# Patient Record
Sex: Female | Born: 1955 | Race: White | Hispanic: No | Marital: Married | Smoking: Never smoker
Health system: Southern US, Community
[De-identification: ages and names within clinical notes are randomized; demographics above are authoritative.]

## PROBLEM LIST (undated history)

## (undated) DIAGNOSIS — T7840XA Allergy, unspecified, initial encounter: Secondary | ICD-10-CM

## (undated) DIAGNOSIS — L80 Vitiligo: Secondary | ICD-10-CM

## (undated) DIAGNOSIS — Z83719 Family history of colon polyps, unspecified: Secondary | ICD-10-CM

## (undated) DIAGNOSIS — I10 Essential (primary) hypertension: Secondary | ICD-10-CM

## (undated) DIAGNOSIS — I341 Nonrheumatic mitral (valve) prolapse: Secondary | ICD-10-CM

## (undated) DIAGNOSIS — J189 Pneumonia, unspecified organism: Secondary | ICD-10-CM

## (undated) DIAGNOSIS — B001 Herpesviral vesicular dermatitis: Secondary | ICD-10-CM

## (undated) DIAGNOSIS — R011 Cardiac murmur, unspecified: Secondary | ICD-10-CM

## (undated) DIAGNOSIS — F988 Other specified behavioral and emotional disorders with onset usually occurring in childhood and adolescence: Secondary | ICD-10-CM

## (undated) DIAGNOSIS — T8859XA Other complications of anesthesia, initial encounter: Secondary | ICD-10-CM

## (undated) DIAGNOSIS — M1990 Unspecified osteoarthritis, unspecified site: Secondary | ICD-10-CM

## (undated) DIAGNOSIS — Z8371 Family history of colonic polyps: Secondary | ICD-10-CM

## (undated) DIAGNOSIS — T4145XA Adverse effect of unspecified anesthetic, initial encounter: Secondary | ICD-10-CM

## (undated) HISTORY — PX: TONSILLECTOMY: SUR1361

## (undated) HISTORY — DX: Herpesviral vesicular dermatitis: B00.1

## (undated) HISTORY — DX: Allergy, unspecified, initial encounter: T78.40XA

## (undated) HISTORY — PX: KNEE ARTHROSCOPY: SUR90

## (undated) HISTORY — DX: Family history of colonic polyps: Z83.71

## (undated) HISTORY — DX: Vitiligo: L80

## (undated) HISTORY — DX: Other specified behavioral and emotional disorders with onset usually occurring in childhood and adolescence: F98.8

## (undated) HISTORY — DX: Family history of colon polyps, unspecified: Z83.719

## (undated) HISTORY — PX: TUBAL LIGATION: SHX77

## (undated) HISTORY — DX: Nonrheumatic mitral (valve) prolapse: I34.1

---

## 1999-08-02 ENCOUNTER — Encounter: Payer: Self-pay | Admitting: Family Medicine

## 1999-08-02 ENCOUNTER — Encounter: Admission: RE | Admit: 1999-08-02 | Discharge: 1999-08-02 | Payer: Self-pay | Admitting: Family Medicine

## 1999-08-16 ENCOUNTER — Encounter: Admission: RE | Admit: 1999-08-16 | Discharge: 1999-08-16 | Payer: Self-pay | Admitting: Obstetrics and Gynecology

## 1999-08-16 ENCOUNTER — Encounter: Payer: Self-pay | Admitting: Obstetrics and Gynecology

## 2002-11-23 ENCOUNTER — Encounter (INDEPENDENT_AMBULATORY_CARE_PROVIDER_SITE_OTHER): Payer: Self-pay | Admitting: Specialist

## 2002-11-23 ENCOUNTER — Ambulatory Visit (HOSPITAL_COMMUNITY): Admission: RE | Admit: 2002-11-23 | Discharge: 2002-11-23 | Payer: Self-pay | Admitting: Gastroenterology

## 2003-12-15 ENCOUNTER — Encounter: Admission: RE | Admit: 2003-12-15 | Discharge: 2003-12-15 | Payer: Self-pay | Admitting: Family Medicine

## 2006-02-18 LAB — HM COLONOSCOPY: HM Colonoscopy: NORMAL

## 2006-06-14 ENCOUNTER — Ambulatory Visit: Payer: Self-pay | Admitting: Family Medicine

## 2007-10-27 ENCOUNTER — Ambulatory Visit: Payer: Self-pay | Admitting: Family Medicine

## 2008-02-24 LAB — HM MAMMOGRAPHY: HM Mammogram: NEGATIVE

## 2008-05-27 ENCOUNTER — Ambulatory Visit: Payer: Self-pay | Admitting: Family Medicine

## 2008-07-02 ENCOUNTER — Ambulatory Visit: Payer: Self-pay | Admitting: Family Medicine

## 2008-08-04 ENCOUNTER — Ambulatory Visit: Payer: Self-pay | Admitting: Family Medicine

## 2009-02-07 ENCOUNTER — Ambulatory Visit: Payer: Self-pay | Admitting: Family Medicine

## 2009-09-13 ENCOUNTER — Ambulatory Visit: Payer: Self-pay | Admitting: Family Medicine

## 2009-09-13 ENCOUNTER — Encounter: Payer: Self-pay | Admitting: Cardiovascular Disease

## 2009-10-01 LAB — HM DEXA SCAN: HM Dexa Scan: NORMAL

## 2010-03-22 ENCOUNTER — Encounter: Payer: Self-pay | Admitting: Cardiovascular Disease

## 2010-03-22 ENCOUNTER — Ambulatory Visit: Payer: Self-pay | Admitting: Family Medicine

## 2010-03-23 ENCOUNTER — Encounter: Payer: Self-pay | Admitting: Cardiovascular Disease

## 2010-04-13 DIAGNOSIS — R0789 Other chest pain: Secondary | ICD-10-CM | POA: Insufficient documentation

## 2010-04-13 DIAGNOSIS — R5383 Other fatigue: Secondary | ICD-10-CM

## 2010-04-13 DIAGNOSIS — R000 Tachycardia, unspecified: Secondary | ICD-10-CM | POA: Insufficient documentation

## 2010-04-13 DIAGNOSIS — R42 Dizziness and giddiness: Secondary | ICD-10-CM | POA: Insufficient documentation

## 2010-04-13 DIAGNOSIS — R5381 Other malaise: Secondary | ICD-10-CM | POA: Insufficient documentation

## 2010-04-13 DIAGNOSIS — I059 Rheumatic mitral valve disease, unspecified: Secondary | ICD-10-CM | POA: Insufficient documentation

## 2010-04-13 DIAGNOSIS — I341 Nonrheumatic mitral (valve) prolapse: Secondary | ICD-10-CM | POA: Insufficient documentation

## 2010-04-18 ENCOUNTER — Ambulatory Visit: Payer: Self-pay | Admitting: Cardiovascular Disease

## 2010-04-26 ENCOUNTER — Telehealth (INDEPENDENT_AMBULATORY_CARE_PROVIDER_SITE_OTHER): Payer: Self-pay | Admitting: *Deleted

## 2010-04-26 ENCOUNTER — Ambulatory Visit: Payer: Self-pay

## 2010-04-26 ENCOUNTER — Encounter: Payer: Self-pay | Admitting: Cardiovascular Disease

## 2010-04-26 ENCOUNTER — Ambulatory Visit (HOSPITAL_COMMUNITY): Admission: RE | Admit: 2010-04-26 | Discharge: 2010-04-26 | Payer: Self-pay | Admitting: Internal Medicine

## 2010-04-26 ENCOUNTER — Ambulatory Visit: Payer: Self-pay | Admitting: Cardiology

## 2010-05-22 ENCOUNTER — Ambulatory Visit: Payer: Self-pay | Admitting: Family Medicine

## 2010-05-24 ENCOUNTER — Telehealth (INDEPENDENT_AMBULATORY_CARE_PROVIDER_SITE_OTHER): Payer: Self-pay | Admitting: *Deleted

## 2010-07-06 ENCOUNTER — Ambulatory Visit: Payer: Self-pay | Admitting: Cardiovascular Disease

## 2010-10-14 ENCOUNTER — Emergency Department (HOSPITAL_COMMUNITY)
Admission: EM | Admit: 2010-10-14 | Discharge: 2010-10-14 | Payer: Self-pay | Source: Home / Self Care | Admitting: Emergency Medicine

## 2010-10-16 LAB — COMPREHENSIVE METABOLIC PANEL
ALT: 12 U/L (ref 0–35)
AST: 14 U/L (ref 0–37)
Albumin: 2.9 g/dL — ABNORMAL LOW (ref 3.5–5.2)
Alkaline Phosphatase: 105 U/L (ref 39–117)
BUN: 11 mg/dL (ref 6–23)
CO2: 28 mEq/L (ref 19–32)
Calcium: 9.3 mg/dL (ref 8.4–10.5)
Chloride: 99 mEq/L (ref 96–112)
Creatinine, Ser: 1.15 mg/dL (ref 0.4–1.2)
GFR calc Af Amer: 60 mL/min — ABNORMAL LOW (ref >60)
GFR calc non Af Amer: 49 mL/min — ABNORMAL LOW (ref >60)
Glucose, Bld: 91 mg/dL (ref 70–99)
Potassium: 4.4 mEq/L (ref 3.5–5.1)
Sodium: 137 mEq/L (ref 135–145)
Total Bilirubin: 0.9 mg/dL (ref 0.3–1.2)
Total Protein: 7.1 g/dL (ref 6.0–8.3)

## 2010-10-16 LAB — URINE MICROSCOPIC-ADD ON

## 2010-10-16 LAB — URINALYSIS, ROUTINE W REFLEX MICROSCOPIC
Bilirubin Urine: NEGATIVE
Hgb urine dipstick: NEGATIVE
Ketones, ur: NEGATIVE mg/dL
Nitrite: NEGATIVE
Protein, ur: 30 mg/dL — AB
Specific Gravity, Urine: 1.021 (ref 1.005–1.030)
Urine Glucose, Fasting: NEGATIVE mg/dL
Urobilinogen, UA: 1 mg/dL (ref 0.0–1.0)
pH: 5.5 (ref 5.0–8.0)

## 2010-10-16 LAB — DIFFERENTIAL
Basophils Absolute: 0 10*3/uL (ref 0.0–0.1)
Basophils Relative: 0 % (ref 0–1)
Eosinophils Absolute: 0 10*3/uL (ref 0.0–0.7)
Eosinophils Relative: 0 % (ref 0–5)
Lymphocytes Relative: 5 % — ABNORMAL LOW (ref 12–46)
Lymphs Abs: 0.6 10*3/uL — ABNORMAL LOW (ref 0.7–4.0)
Monocytes Absolute: 0.7 10*3/uL (ref 0.1–1.0)
Monocytes Relative: 6 % (ref 3–12)
Neutro Abs: 11.4 10*3/uL — ABNORMAL HIGH (ref 1.7–7.7)
Neutrophils Relative %: 89 % — ABNORMAL HIGH (ref 43–77)

## 2010-10-16 LAB — CBC
HCT: 40.6 % (ref 36.0–46.0)
Hemoglobin: 13.8 g/dL (ref 12.0–15.0)
MCH: 33.1 pg (ref 26.0–34.0)
MCHC: 34 g/dL (ref 30.0–36.0)
MCV: 97.4 fL (ref 78.0–100.0)
Platelets: 207 10*3/uL (ref 150–400)
RBC: 4.17 MIL/uL (ref 3.87–5.11)
RDW: 12.1 % (ref 11.5–15.5)
WBC: 12.8 10*3/uL — ABNORMAL HIGH (ref 4.0–10.5)

## 2010-10-18 LAB — URINE CULTURE
Colony Count: NO GROWTH
Culture  Setup Time: 201201150320
Culture: NO GROWTH

## 2010-10-31 NOTE — Progress Notes (Signed)
  Phone Note Outgoing Call   Call placed by: Marcos Eke,  May 24, 2010 12:29 PM Summary of Call: Pt never call back for appt for monitor she was called on 04/20/10 and 8/2/11left messege for her to call to sch appt for monitor.r  Follow-up for Phone Call       Follow-up by: Marcos Eke,  May 24, 2010 12:27 PM

## 2010-10-31 NOTE — Progress Notes (Signed)
Summary: Office Visit  Office Visit   Imported By: Earl Many 04/13/2010 12:04:31  _____________________________________________________________________  External Attachment:    Type:   Image     Comment:   External Document

## 2010-10-31 NOTE — Progress Notes (Signed)
Summary: Office Visit  Office Visit   Imported By: Earl Many 04/13/2010 12:03:03  _____________________________________________________________________  External Attachment:    Type:   Image     Comment:   External Document

## 2010-10-31 NOTE — Letter (Signed)
Summary: Dr Everardo All Lalonde's Office Note   Dr Everardo All Lalonde's Office Note   Imported By: Roderic Ovens 04/26/2010 14:31:07  _____________________________________________________________________  External Attachment:    Type:   Image     Comment:   External Document

## 2010-10-31 NOTE — Assessment & Plan Note (Signed)
Summary: np3/tachycardia/jml   Primary Provider:  Dr. Susann Givens  CC:  pt complains of increased heart rate pt thinks its related to anxiety.  History of Present Illness: Annette Murray is seen today at the requesto fo her primary.  She has had panic attacks for about 3 weeks.  There is a lot of stress at work and her husband has been diagnosed with prostate cancer.  She had panic attacks 18 years ago but they resolved.  She gets more somatic symptoms than before with SSCP, palpitations and occasional diaphoresis.  She is on Adderall for ADD as it helps her focus.  She has been on this for a long time and it is prescribed by a psychologist.  She has been having the "attacks" a couple of times/week.  She feels drained afterward and they can last for about an hour.  There has been no frank syncope.  She has not had any recent cardiac w/u. She indicates a vague previous history of MVP  Current Problems (verified): 1)  Mitral Valve Prolapse  (ICD-424.0) 2)  Tachycardia  (ICD-785.0) 3)  Chest Discomfort  (ICD-786.59) 4)  Dizziness  (ICD-780.4) 5)  Fatigue  (ICD-780.79)  Current Medications (verified): 1)  Multivitamins   Tabs (Multiple Vitamin) .Marland Kitchen.. 1 Tab By Mouth Once Daily 2)  Vitamin D 2000 Unit Caps (Cholecalciferol) .Marland Kitchen.. 1 Tab By Mouth Once Daily 3)  Hydrochlorothiazide 12.5 Mg Tabs (Hydrochlorothiazide) .... Take One Tablet By Mouth Daily. 4)  Zovirax 400 Mg Tabs (Acyclovir) .Marland Kitchen.. 1  Tab By Mouth Once Daily 5)  Adderall 30 Mg Tabs (Amphetamine-Dextroamphetamine) .Marland Kitchen.. 1 Tab By Mouth Once Daily 6)  Trazodone Hcl 50 Mg Tabs (Trazodone Hcl) .... As Needed 7)  Atenolol 25 Mg Tabs (Atenolol) .... Take 1 Tablet Daily  Allergies (verified): No Known Drug Allergies  Past History:  Past Medical History: Last updated: 04/13/2010 MITRAL VALVE PROLAPSE  TACHYCARDIA  CHEST DISCOMFORT DIZZINESS  FATIGUE   Past Surgical History: Last updated: 04/13/2010   Colonoscopy with polypectomy.  Family  History: Last updated: 04/18/2010 non-contributory  Social History: Last updated: 04/18/2010 Married Two children Nonsmoker Sedentary  Family History: non-contributory  Social History: Married Two children Nonsmoker Sedentary  Review of Systems       Denies fever, malais, weight loss, blurry vision, decreased visual acuity, cough, sputum, SOB, hemoptysis, pleuritic pain, , heartburn, abdominal pain, melena, lower extremity edema, claudication, or rash.   Vital Signs:  Patient profile:   55 year old female Height:      62 inches Weight:      120 pounds BMI:     22.03 Pulse rate:   95 / minute Resp:     12 per minute BP sitting:   130 / 82  (left arm)  Vitals Entered By: Kem Parkinson (April 18, 2010 11:50 AM)  Physical Exam  General:  Affect appropriate Healthy:  appears stated age HEENT: normal Neck supple with no adenopathy JVP normal no bruits no thyromegaly Lungs clear with no wheezing and good diaphragmatic motion Heart:  S1/S2 no murmur,rub, gallop or click PMI normal Abdomen: benighn, BS positve, no tenderness, no AAA no bruit.  No HSM or HJR Distal pulses intact with no bruits No edema Neuro non-focal Skin warm and dry    Impression & Recommendations:  Problem # 1:  MITRAL VALVE PROLAPSE (ICD-424.0) No sig murmur.  Echo Her updated medication list for this problem includes:    Hydrochlorothiazide 12.5 Mg Tabs (Hydrochlorothiazide) .Marland Kitchen... Take one tablet by mouth daily.  Atenolol 25 Mg Tabs (Atenolol) .Marland Kitchen... Take 1 tablet daily  Orders: Echocardiogram (Echo)  Problem # 2:  TACHYCARDIA (ICD-785.0) Start atenolol.  F/U with psychologist to see if Adderalll can be stopped or non-stimulant drug like concerta be used Orders: Event monitor to R/O PSVT Event (Event)  Patient Instructions: 1)  Your physician recommends that you schedule a follow-up appointment in: 4-5 weeks with Dr. Eden Emms 2)  Your physician has recommended you make the  following change in your medication:  START Atenolol 25mg  1 tablet daily 3)  Your physician has recommended that you wear an event monitor.  Event monitors are medical devices that record the heart's electrical activity. Doctors most often use these monitors to diagnose arrhythmias. Arrhythmias are problems with the speed or rhythm of the heartbeat. The monitor is a small, portable device. You can wear one while you do your normal daily activities. This is usually used to diagnose what is causing palpitations/syncope (passing out).  TO wear for 3-4 weeks 4)  Your physician has requested that you have an echocardiogram.  Echocardiography is a painless test that uses sound waves to create images of your heart. It provides your doctor with information about the size and shape of your heart and how well your heart's chambers and valves are working.  This procedure takes approximately one hour. There are no restrictions for this procedure. Prescriptions: ATENOLOL 25 MG TABS (ATENOLOL) Take 1 tablet daily  #30 x 6   Entered by:   Lisabeth Devoid RN   Authorized by:   Colon Branch, MD, [facility]   Signed by:   Lisabeth Devoid RN on 04/18/2010   Method used:   Electronically to        [facility]* (retail)       [address]       [place], [place]  [address]       Ph: [phone] or [phone]       Fax: [phone]   RxID:   [id number]    Echocardiogram Report  Procedure date:  5/11  Findings:      ECG form primary ? May NSR Sinus arrythmia Otherwise normal

## 2010-10-31 NOTE — Progress Notes (Signed)
  Phone Note Outgoing Call   Summary of Call: Left messege for patient to call for monitor appt. Initial call taken by: Marcos Eke,  April 26, 2010 4:37 PM

## 2010-10-31 NOTE — Assessment & Plan Note (Signed)
Summary: 56   CC:  no compliants. \ Current Problems (verified): 1)  Mitral Valve Prolapse  (ICD-424.0) 2)  Tachycardia  (ICD-785.0) 3)  Chest Discomfort  (ICD-786.59) 4)  Dizziness  (ICD-780.4) 5)  Fatigue  (ICD-780.79)  Current Medications (verified): 1)  Multivitamins   Tabs (Multiple Vitamin) .Marland Kitchen.. 1 Tab By Mouth Once Daily 2)  Vitamin D 2000 Unit Caps (Cholecalciferol) .Marland Kitchen.. 1 Tab By Mouth Once Daily 3)  Hydrochlorothiazide 12.5 Mg Tabs (Hydrochlorothiazide) .... Take One Tablet By Mouth Daily. 4)  Zovirax 400 Mg Tabs (Acyclovir) .Marland Kitchen.. 1  Tab By Mouth Once Daily 5)  Adderall 10 Mg Tabs (Amphetamine-Dextroamphetamine) .Marland Kitchen.. 1 Tab By Mouth Once Daily 6)  Trazodone Hcl 50 Mg Tabs (Trazodone Hcl) .... As Needed 7)  Atenolol 25 Mg Tabs (Atenolol) .... Has Not Started....take 1 Tablet Daily  Allergies (verified): No Known Drug Allergies  Past History:  Past Medical History: Last updated: 04/13/2010 MITRAL VALVE PROLAPSE  TACHYCARDIA  CHEST DISCOMFORT DIZZINESS  FATIGUE   Past Surgical History: Last updated: 04/13/2010   Colonoscopy with polypectomy.  Family History: Last updated: 04/18/2010 non-contributory  Social History: Last updated: 04/18/2010 Married Two children Nonsmoker Sedentary  Review of Systems       Denies fever, malais, weight loss, blurry vision, decreased visual acuity, cough, sputum, SOB, hemoptysis, pleuritic pain, palpitaitons, heartburn, abdominal pain, melena, lower extremity edema, claudication, or rash.   Vital Signs:  Patient profile:   55 year old female Height:      62 inches Weight:      120 pounds BMI:     22.03 Pulse rate:   73 / minute Resp:     12 per minute BP sitting:   133 / 81  (left arm)  Vitals Entered By: Kem Parkinson (July 06, 2010 11:32 AM)  Physical Exam  General:  Affect appropriate Healthy:  appears stated age HEENT: normal Neck supple with no adenopathy JVP normal no bruits no thyromegaly Lungs  clear with no wheezing and good diaphragmatic motion Heart:  S1/S2  MR with midsystolic initiations murmur,rub, gallop or click PMI normal Abdomen: benighn, BS positve, no tenderness, no AAA no bruit.  No HSM or HJR Distal pulses intact with no bruits No edema Neuro non-focal Skin warm and dry    Impression & Recommendations:  Problem # 1:  MITRAL VALVE PROLAPSE (ICD-424.0) Normal LV function and size.  No need for SBE, F/u echo in 7/12 Her updated medication list for this problem includes:    Hydrochlorothiazide 12.5 Mg Tabs (Hydrochlorothiazide) .Marland Kitchen... Take one tablet by mouth daily.    Atenolol 25 Mg Tabs (Atenolol) ..... Has not started....take 1 tablet daily  Orders: Echocardiogram (Echo)  Problem # 2:  TACHYCARDIA (ICD-785.0) Improved with decreased Adderall.  Encouraged to take Atenolol.  she will fill script and let us know how she is doing.  Does seem to be a bit less focused on lower dose of Adderall.  Did not tolerate Concerta in past  Continue avoidance of caffeine  Patient Instructions: 1)  Your physician recommends that you schedule a follow-up appointment in: JULY 2012  WITH DR Eden Emms 2)  Your physician recommends that you continue on your current medications as directed. Please refer to the Current Medication list given to you today. 3)  Your physician has requested that you have an echocardiogram.  Echocardiography is a painless test that uses sound waves to create images of your heart. It provides your doctor with information about the size and shape  of your heart and how well your heart's chambers and valves are working.  This procedure takes approximately one hour. There are no restrictions for this procedure.JULY 2012 SEE DR Eden Emms SAME DAY Prescriptions: ATENOLOL 25 MG TABS (ATENOLOL) HAS not started.Marland KitchenMarland KitchenMarland KitchenTake 1 tablet daily  #30 x 11   Entered by:   Scherrie Bateman, LPN   Authorized by:   Colon Branch, MD, [facility]   Signed by:   Scherrie Bateman, LPN on  16/07/9603   Method used:   Electronically to        [facility]* (retail)       [address]       [place], [place]  [address]       Ph: [phone]       Fax: [phone]   RxID:   [id number]   Prevention & Chronic Care Immunizations   Influenza vaccine: Not documented    Tetanus booster: Not documented    Pneumococcal vaccine: Not documented  Colorectal Screening   Hemoccult: Not documented    Colonoscopy: Not documented  Other Screening   Pap smear: Not documented    Mammogram: Not documented   Smoking status: Not documented  Lipids   Total Cholesterol: Not documented   LDL: Not documented   LDL Direct: Not documented   HDL: Not documented   Triglycerides: Not documented  Appended Document: 55 HPI:  Some stress as husband had prostate surgery and doing ok.  Echo 7/11 reviewed with bileaflet prolapse moderate MR normal LV.  Decreased her adderall and less cafeine has helped  No SSCP, dyspnea or diaphoresis.  Encourged her to speak with psychologist about alternatives to Adderall.  She has not tolerated Concerta in past.  Encouraged her to take low dose Atenolol and have already called it into [facility] drug.

## 2011-02-16 NOTE — Op Note (Signed)
   NAME:  Annette Murray, Annette Murray                     ACCOUNT NO.:  [id number]   MEDICAL RECORD NO.:  [id number]                   PATIENT TYPE:  AMB   LOCATION:  ENDO                                 FACILITY:  [facility]   PHYSICIAN:  James L. Malon Kindle., M.D.          DATE OF BIRTH:  08/16/1956   DATE OF PROCEDURE:  11/23/2002  DATE OF DISCHARGE:                                 OPERATIVE REPORT   PROCEDURE:  Colonoscopy with polypectomy.   MEDICATIONS:  Fentanyl 100 mcg, Versed 8 mg IV.   SCOPE:  Olympus pediatric colonoscope.   INDICATIONS FOR PROCEDURE:  The patient has had strong family history of  colon cancer, had a small rectal polyp removed. This is done as a five year  repeat.   DESCRIPTION OF PROCEDURE:  The procedure had been explained to the patient  and consent obtained. With the patient in the left lateral decubitus  position, the Olympus scope was inserted and advanced under direct  visualization. The prep was quite good. We arrived at the cecum and  appendiceal orifice and ileocecal valve were seen. There was approximately a  3/4 cm sessile polyp at the cecum. It was removed with the snare in three  small pieces. We felt like we got all of the polyp. There was on significant  bleeding. The scope was withdrawn and the remainder of the cecum, ascending  colon, transverse, descending and sigmoid colon were seen well and no  further polyps were seen and the rectum was also free of polyps. The patient  tolerated the procedure well and was maintained on low flow oxygen and pulse  oximeter throughout the procedure.   ASSESSMENT:  Cecal polyps removed.    PLAN:  1. Will check path and recommend repeating in 1-2 years depending on the     results with the APC on standby.  2. Routine post polypectomy instructions.                                               James L. Malon Kindle., M.D.    Waldron Session  D:  11/23/2002  T:  11/23/2002  Job:  [id number]   cc:   Sharlot Gowda,  M.D.  [address]  [place], [place] [address]  Fax: [phone]   S. Kyra Manges, M.D.  [phone] [address]  [place]  [place] [address]  Fax: [phone]

## 2011-05-08 ENCOUNTER — Telehealth: Payer: Self-pay

## 2011-05-08 NOTE — Telephone Encounter (Signed)
Called pt to let her know she needs appt

## 2011-05-29 ENCOUNTER — Encounter: Payer: Self-pay | Admitting: Family Medicine

## 2011-05-30 ENCOUNTER — Ambulatory Visit (INDEPENDENT_AMBULATORY_CARE_PROVIDER_SITE_OTHER): Payer: BC Managed Care – PPO | Admitting: Family Medicine

## 2011-05-30 ENCOUNTER — Encounter: Payer: Self-pay | Admitting: Family Medicine

## 2011-05-30 VITALS — BP 114/80 | HR 62 | Wt 120.0 lb

## 2011-05-30 DIAGNOSIS — F901 Attention-deficit hyperactivity disorder, predominantly hyperactive type: Secondary | ICD-10-CM | POA: Insufficient documentation

## 2011-05-30 DIAGNOSIS — I341 Nonrheumatic mitral (valve) prolapse: Secondary | ICD-10-CM

## 2011-05-30 DIAGNOSIS — F909 Attention-deficit hyperactivity disorder, unspecified type: Secondary | ICD-10-CM

## 2011-05-30 DIAGNOSIS — M25561 Pain in right knee: Secondary | ICD-10-CM

## 2011-05-30 DIAGNOSIS — M25569 Pain in unspecified knee: Secondary | ICD-10-CM

## 2011-05-30 DIAGNOSIS — B001 Herpesviral vesicular dermatitis: Secondary | ICD-10-CM | POA: Insufficient documentation

## 2011-05-30 DIAGNOSIS — I059 Rheumatic mitral valve disease, unspecified: Secondary | ICD-10-CM

## 2011-05-30 DIAGNOSIS — Z23 Encounter for immunization: Secondary | ICD-10-CM

## 2011-05-30 DIAGNOSIS — B009 Herpesviral infection, unspecified: Secondary | ICD-10-CM

## 2011-05-30 DIAGNOSIS — Z79899 Other long term (current) drug therapy: Secondary | ICD-10-CM

## 2011-05-30 LAB — CBC WITH DIFFERENTIAL/PLATELET
Basophils Absolute: 0 10*3/uL (ref 0.0–0.1)
Basophils Relative: 0 % (ref 0–1)
Eosinophils Absolute: 0.1 10*3/uL (ref 0.0–0.7)
Eosinophils Relative: 2 % (ref 0–5)
HCT: 44.2 % (ref 36.0–46.0)
Hemoglobin: 14.8 g/dL (ref 12.0–15.0)
Lymphocytes Relative: 49 % — ABNORMAL HIGH (ref 12–46)
Lymphs Abs: 1.6 10*3/uL (ref 0.7–4.0)
MCH: 32.8 pg (ref 26.0–34.0)
MCHC: 33.5 g/dL (ref 30.0–36.0)
MCV: 98 fL (ref 78.0–100.0)
Monocytes Absolute: 0.3 10*3/uL (ref 0.1–1.0)
Monocytes Relative: 10 % (ref 3–12)
Neutro Abs: 1.3 10*3/uL — ABNORMAL LOW (ref 1.7–7.7)
Neutrophils Relative %: 39 % — ABNORMAL LOW (ref 43–77)
Platelets: 241 10*3/uL (ref 150–400)
RBC: 4.51 MIL/uL (ref 3.87–5.11)
RDW: 12.5 % (ref 11.5–15.5)
WBC: 3.4 10*3/uL — ABNORMAL LOW (ref 4.0–10.5)

## 2011-05-30 LAB — COMPREHENSIVE METABOLIC PANEL
ALT: 14 U/L (ref 0–35)
AST: 22 U/L (ref 0–37)
Albumin: 4.6 g/dL (ref 3.5–5.2)
Alkaline Phosphatase: 70 U/L (ref 39–117)
BUN: 9 mg/dL (ref 6–23)
CO2: 25 mEq/L (ref 19–32)
Calcium: 9.7 mg/dL (ref 8.4–10.5)
Chloride: 104 mEq/L (ref 96–112)
Creat: 0.77 mg/dL (ref 0.50–1.10)
Glucose, Bld: 83 mg/dL (ref 70–99)
Potassium: 4.8 mEq/L (ref 3.5–5.3)
Sodium: 142 mEq/L (ref 135–145)
Total Bilirubin: 0.6 mg/dL (ref 0.3–1.2)
Total Protein: 6.6 g/dL (ref 6.0–8.3)

## 2011-05-30 LAB — LIPID PANEL
Cholesterol: 262 mg/dL — ABNORMAL HIGH (ref 0–200)
HDL: 89 mg/dL (ref >39)
LDL Cholesterol: 155 mg/dL — ABNORMAL HIGH (ref 0–99)
Total CHOL/HDL Ratio: 2.9 Ratio
Triglycerides: 89 mg/dL (ref <150)
VLDL: 18 mg/dL (ref 0–40)

## 2011-05-30 MED ORDER — HYDROCHLOROTHIAZIDE 12.5 MG PO CAPS: 12.5000 mg | ORAL_CAPSULE | Freq: Every day | ORAL | Status: DC

## 2011-05-30 MED ORDER — VALACYCLOVIR HCL 500 MG PO TABS: 500.0000 mg | ORAL_TABLET | Freq: Every day | ORAL | Status: DC

## 2011-05-30 NOTE — Progress Notes (Signed)
  Subjective:    Patient ID: Annette Murray, female    DOB: 1956/05/01, 55 y.o.   MRN: [id number]  HPI She is here for an interval evaluation. She does have a history of MVP and is scheduled to see her cardiologist for possible repeat echocardiogram. She has not had any chest pain or palpitations. She continues on atenolol and her HCTZ. She also uses acyclovir for control of herpes labialis. She is doing well on her ADD medication and would like to continue. This is prescribed by her psychiatrist. She is being evaluated by orthopedics for possible another arthroscopy. There is a family history of colon cancer however she is going to have this done after the knee is taking care of.   Review of Systems     Objective:   Physical Exam alert and in no distress. Tympanic membranes and canals are normal. Throat is clear. Tonsils are normal. Neck is supple without adenopathy or thyromegaly. Cardiac exam shows a regular sinus rhythm without murmurs or gallops. Lungs are clear to auscultation.        Assessment & Plan:  MVP. Herpes labialis. ADD. Internal derangement of right knee. Continue on present medication regimen. I will renew her acyclovir and HCTZ. Routine blood screening also done.

## 2011-08-22 ENCOUNTER — Other Ambulatory Visit: Payer: Self-pay | Admitting: *Deleted

## 2011-08-22 MED ORDER — ATENOLOL 25 MG PO TABS: 25.0000 mg | ORAL_TABLET | Freq: Every day | ORAL | Status: DC

## 2011-09-20 ENCOUNTER — Ambulatory Visit (INDEPENDENT_AMBULATORY_CARE_PROVIDER_SITE_OTHER): Payer: BC Managed Care – PPO | Admitting: Cardiovascular Disease

## 2011-09-20 ENCOUNTER — Encounter: Payer: Self-pay | Admitting: Cardiovascular Disease

## 2011-09-20 VITALS — BP 129/77 | HR 61 | Ht 62.0 in | Wt 116.0 lb

## 2011-09-20 DIAGNOSIS — I341 Nonrheumatic mitral (valve) prolapse: Secondary | ICD-10-CM

## 2011-09-20 DIAGNOSIS — I059 Rheumatic mitral valve disease, unspecified: Secondary | ICD-10-CM

## 2011-09-20 DIAGNOSIS — F909 Attention-deficit hyperactivity disorder, unspecified type: Secondary | ICD-10-CM

## 2011-09-20 DIAGNOSIS — R000 Tachycardia, unspecified: Secondary | ICD-10-CM

## 2011-09-20 NOTE — Assessment & Plan Note (Signed)
Resolved on atenolol

## 2011-09-20 NOTE — Patient Instructions (Signed)
Your physician wants you to follow-up in: YEAR WITH DR Haywood Filler will receive a reminder letter in the mail two months in advance. If you don't receive a letter, please call our office to schedule the follow-up appointment. Your physician recommends that you continue on your current medications as directed. Please refer to the Current Medication list given to you today. Your physician has requested that you have an echocardiogram. Echocardiography is a painless test that uses sound waves to create images of your heart. It provides your doctor with information about the size and shape of your heart and how well your heart's chambers and valves are working. This procedure takes approximately one hour. There are no restrictions for this procedure. DX MVP

## 2011-09-20 NOTE — Assessment & Plan Note (Signed)
F/U echo no impressive murmur on exam

## 2011-09-20 NOTE — Assessment & Plan Note (Signed)
Intolerant to concerta.  Functional with lower dose of adderal

## 2011-09-20 NOTE — Progress Notes (Signed)
F/U for palpitatons and panic attacks with MVP. There is a lot of stress at work and her husband has been diagnosed with prostate cancer. She had panic attacks 18 years ago but they resolved. She gets more somatic symptoms than before with SSCP, palpitations and occasional diaphoresis. She is on Adderall for ADD as it helps her focus. She has been on this for a long time and it is prescribed by a psychologist. She has been having the "attacks" a couple of times/week. She feels drained afterward and they can last for about an hour. There has been no frank syncope.  Echo 7/11 bileaflet prolapse and moderate MR  ROS: Denies fever, malais, weight loss, blurry vision, decreased visual acuity, cough, sputum, SOB, hemoptysis, pleuritic pain, palpitaitons, heartburn, abdominal pain, melena, lower extremity edema, claudication, or rash.  All other systems reviewed and negative  General: Affect appropriate Healthy:  appears stated age HEENT: normal Neck supple with no adenopathy JVP normal no bruits no thyromegaly Lungs clear with no wheezing and good diaphragmatic motion Heart:  S1/S2 no murmur,rub, gallop or click PMI normal Abdomen: benighn, BS positve, no tenderness, no AAA no bruit.  No HSM or HJR Distal pulses intact with no bruits No edema Neuro non-focal Skin warm and dry No muscular weakness   Current Outpatient Prescriptions  Medication Sig Dispense Refill  . amphetamine-dextroamphetamine (ADDERALL XR) 15 MG 24 hr capsule Take 10 mg by mouth daily.       Marland Kitchen atenolol (TENORMIN) 25 MG tablet Take 1 tablet (25 mg total) by mouth daily.  30 tablet  2  . BIOTIN PO Take 1 tablet by mouth daily.        . hydrochlorothiazide (,MICROZIDE/HYDRODIURIL,) 12.5 MG capsule Take 1 capsule (12.5 mg total) by mouth daily.  30 capsule  12  . Multiple Vitamin (MULTIVITAMIN) capsule Take 1 capsule by mouth daily.        . Omega-3 Fatty Acids (FISH OIL PO) Take 1 tablet by mouth daily.        . TURMERIC PO  Take 2 tablets by mouth daily.        . valACYclovir (VALTREX) 500 MG tablet Take 1 tablet (500 mg total) by mouth daily.  30 tablet  12    Allergies  Codeine  Electrocardiogram:  NSR 61 normal ECG  Assessment and Plan

## 2011-09-21 ENCOUNTER — Ambulatory Visit (HOSPITAL_COMMUNITY): Payer: BC Managed Care – PPO | Attending: Cardiovascular Disease | Admitting: Radiology

## 2011-09-21 DIAGNOSIS — I341 Nonrheumatic mitral (valve) prolapse: Secondary | ICD-10-CM

## 2011-09-21 DIAGNOSIS — I059 Rheumatic mitral valve disease, unspecified: Secondary | ICD-10-CM

## 2011-09-21 DIAGNOSIS — I079 Rheumatic tricuspid valve disease, unspecified: Secondary | ICD-10-CM | POA: Insufficient documentation

## 2011-10-01 ENCOUNTER — Telehealth: Payer: Self-pay | Admitting: Family Medicine

## 2011-10-01 ENCOUNTER — Other Ambulatory Visit: Payer: Self-pay | Admitting: Cardiovascular Disease

## 2011-10-01 DIAGNOSIS — B009 Herpesviral infection, unspecified: Secondary | ICD-10-CM

## 2011-10-01 MED ORDER — ATENOLOL 25 MG PO TABS: 25.0000 mg | ORAL_TABLET | Freq: Every day | ORAL | Status: DC

## 2011-10-01 MED ORDER — HYDROCHLOROTHIAZIDE 12.5 MG PO CAPS: 12.5000 mg | ORAL_CAPSULE | Freq: Every day | ORAL | Status: DC

## 2011-10-01 MED ORDER — VALACYCLOVIR HCL 500 MG PO TABS: 500.0000 mg | ORAL_TABLET | Freq: Every day | ORAL | Status: DC

## 2011-10-01 NOTE — Telephone Encounter (Signed)
Sent orders to [facility] for 90 days rx's with 3 refills. This was just a change in pharmacy to mail order.

## 2011-10-05 ENCOUNTER — Telehealth: Payer: Self-pay | Admitting: Cardiovascular Disease

## 2011-10-05 NOTE — Telephone Encounter (Signed)
PT AWARE OF ECHO RESULTS./CY 

## 2011-10-05 NOTE — Telephone Encounter (Signed)
FU Call: Pt returning call from [place] from yesterday. Please return pt call to discuss further.

## 2011-10-17 ENCOUNTER — Other Ambulatory Visit: Payer: Self-pay | Admitting: *Deleted

## 2011-10-17 MED ORDER — ATENOLOL 25 MG PO TABS: 25.0000 mg | ORAL_TABLET | Freq: Every day | ORAL | Status: DC

## 2012-02-07 ENCOUNTER — Other Ambulatory Visit: Payer: Self-pay | Admitting: Orthopedic Surgery

## 2012-02-07 NOTE — Progress Notes (Signed)
Preoperative surgical orders have been place into the Epic hospital system for Olegario Shearer on 02/07/2012, 5:28 PM  by Patrica Duel for surgery on 04/28/2012.  Preop [facility] K[facility] orders including Bupivacaine On-Q pump, IV Tylenol, and IV Decadron as long as there are no contraindications to the above medications.

## 2012-02-08 ENCOUNTER — Telehealth: Payer: Self-pay

## 2012-02-08 NOTE — Telephone Encounter (Signed)
Called left message we had received letter of clearance for surgery Dr.Lalonde request that she has appt before he can sign off

## 2012-03-05 ENCOUNTER — Encounter: Payer: Self-pay | Admitting: Family Medicine

## 2012-03-05 ENCOUNTER — Ambulatory Visit (INDEPENDENT_AMBULATORY_CARE_PROVIDER_SITE_OTHER): Payer: BC Managed Care – PPO | Admitting: Family Medicine

## 2012-03-05 VITALS — BP 140/90 | HR 63 | Wt 115.0 lb

## 2012-03-05 DIAGNOSIS — I059 Rheumatic mitral valve disease, unspecified: Secondary | ICD-10-CM

## 2012-03-05 DIAGNOSIS — I10 Essential (primary) hypertension: Secondary | ICD-10-CM

## 2012-03-05 DIAGNOSIS — M1711 Unilateral primary osteoarthritis, right knee: Secondary | ICD-10-CM

## 2012-03-05 DIAGNOSIS — F909 Attention-deficit hyperactivity disorder, unspecified type: Secondary | ICD-10-CM

## 2012-03-05 DIAGNOSIS — M1710 Unilateral primary osteoarthritis, unspecified knee: Secondary | ICD-10-CM

## 2012-03-05 MED ORDER — HYDROQUINONE 4 % EX CREA: TOPICAL_CREAM | Freq: Two times a day (BID) | CUTANEOUS | Status: AC

## 2012-03-05 MED ORDER — ATENOLOL-CHLORTHALIDONE 50-25 MG PO TABS: 1.0000 | ORAL_TABLET | Freq: Every day | ORAL | Status: DC

## 2012-03-05 NOTE — Progress Notes (Signed)
  Subjective:    Patient ID: Annette Murray, female    DOB: July 17, 1956, 56 y.o.   MRN: [id number]  HPI She is here for an interval evaluation and preoperative evaluation. She does have a history of MVP and has seen her cardiologist recently. She was placed on atenolol to help with her blood pressure. Prior to that she also been on HCTZ. She continues to be followed by Dr. Evelene Croon for underlying ADD and is doing well on this medication. She has not had any difficulty with chest pain, shortness of breath, cough, congestion. She is scheduled for total right knee replacement.   Review of Systems     Objective:   Physical Exam alert and in no distress. Tympanic membranes and canals are normal. Throat is clear. Tonsils are normal. Neck is supple without adenopathy or thyromegaly. Cardiac exam shows a regular sinus rhythm without murmurs or gallops. Lungs are clear to auscultation.        Assessment & Plan:   1. Hypertension  atenolol-chlorthalidone (TENORETIC 50) 50-25 MG per tablet  2. MITRAL VALVE PROLAPSE    3. ADHD (attention deficit hyperactivity disorder)    4. Arthritis of right knee     she is to return here in one month for recheck on her blood pressure.

## 2012-03-14 LAB — HM MAMMOGRAPHY: HM Mammogram: NORMAL

## 2012-03-14 LAB — HM PAP SMEAR: HM Pap smear: NORMAL

## 2012-04-08 ENCOUNTER — Encounter: Payer: Self-pay | Admitting: Family Medicine

## 2012-04-08 ENCOUNTER — Ambulatory Visit (INDEPENDENT_AMBULATORY_CARE_PROVIDER_SITE_OTHER): Payer: BC Managed Care – PPO | Admitting: Family Medicine

## 2012-04-08 VITALS — BP 124/78 | HR 60 | Wt 116.0 lb

## 2012-04-08 DIAGNOSIS — R000 Tachycardia, unspecified: Secondary | ICD-10-CM

## 2012-04-08 DIAGNOSIS — I10 Essential (primary) hypertension: Secondary | ICD-10-CM

## 2012-04-08 NOTE — Progress Notes (Signed)
  Subjective:    Patient ID: Annette Murray, female    DOB: 15-Aug-1956, 56 y.o.   MRN: [id number]  HPI She is here for recheck. She has not started on the Tenoretic stating she has had difficulty getting this from the pharmaceutical house. She continues on her previous dosing regimen and seems to be doing well on this. She is scheduled to have knee replacement done within the next several weeks. She has no other concerns or complaints.   Review of Systems     Objective:   Physical Exam Alert and in no distress otherwise not examined       Assessment & Plan:   1. Hypertension   2. TACHYCARDIA    she will switch to Tenoretic and return here in several months after she has had her knee surgery and is back to her normal routine.

## 2012-04-21 ENCOUNTER — Ambulatory Visit (HOSPITAL_COMMUNITY)
Admission: RE | Admit: 2012-04-21 | Discharge: 2012-04-21 | Disposition: A | Discharge status: Home / Self Care | Payer: BC Managed Care – PPO | Source: Ambulatory Visit | Attending: Orthopedic Surgery | Admitting: Orthopedic Surgery

## 2012-04-21 ENCOUNTER — Encounter (HOSPITAL_COMMUNITY): Payer: Self-pay | Admitting: Pharmacy Technician

## 2012-04-21 ENCOUNTER — Encounter (HOSPITAL_COMMUNITY)
Admission: RE | Admit: 2012-04-21 | Discharge: 2012-04-21 | Disposition: A | Discharge status: Home / Self Care | Payer: BC Managed Care – PPO | Source: Ambulatory Visit | Attending: Orthopedic Surgery | Admitting: Orthopedic Surgery

## 2012-04-21 ENCOUNTER — Encounter (HOSPITAL_COMMUNITY): Payer: Self-pay

## 2012-04-21 DIAGNOSIS — I10 Essential (primary) hypertension: Secondary | ICD-10-CM | POA: Insufficient documentation

## 2012-04-21 DIAGNOSIS — M1710 Unilateral primary osteoarthritis, unspecified knee: Secondary | ICD-10-CM | POA: Insufficient documentation

## 2012-04-21 DIAGNOSIS — Z01812 Encounter for preprocedural laboratory examination: Secondary | ICD-10-CM | POA: Insufficient documentation

## 2012-04-21 HISTORY — DX: Adverse effect of unspecified anesthetic, initial encounter: T41.45XA

## 2012-04-21 HISTORY — DX: Pneumonia, unspecified organism: J18.9

## 2012-04-21 HISTORY — DX: Unspecified osteoarthritis, unspecified site: M19.90

## 2012-04-21 HISTORY — DX: Other complications of anesthesia, initial encounter: T88.59XA

## 2012-04-21 HISTORY — DX: Cardiac murmur, unspecified: R01.1

## 2012-04-21 HISTORY — DX: Essential (primary) hypertension: I10

## 2012-04-21 LAB — URINALYSIS, ROUTINE W REFLEX MICROSCOPIC
Bilirubin Urine: NEGATIVE
Glucose, UA: NEGATIVE mg/dL
Hgb urine dipstick: NEGATIVE
Specific Gravity, Urine: 1.021 (ref 1.005–1.030)
Urobilinogen, UA: 1 mg/dL (ref 0.0–1.0)
pH: 7 (ref 5.0–8.0)

## 2012-04-21 LAB — COMPREHENSIVE METABOLIC PANEL
ALT: 14 U/L (ref 0–35)
AST: 21 U/L (ref 0–37)
Albumin: 4.4 g/dL (ref 3.5–5.2)
Calcium: 9.7 mg/dL (ref 8.4–10.5)
Creatinine, Ser: 0.76 mg/dL (ref 0.50–1.10)
Sodium: 139 mEq/L (ref 135–145)
Total Protein: 7 g/dL (ref 6.0–8.3)

## 2012-04-21 LAB — CBC
MCH: 32.1 pg (ref 26.0–34.0)
MCV: 94.7 fL (ref 78.0–100.0)
Platelets: 235 10*3/uL (ref 150–400)
RBC: 4.74 MIL/uL (ref 3.87–5.11)
RDW: 12.2 % (ref 11.5–15.5)

## 2012-04-21 LAB — SURGICAL PCR SCREEN
MRSA, PCR: NEGATIVE
Staphylococcus aureus: NEGATIVE

## 2012-04-21 NOTE — Patient Instructions (Signed)
20 MELIS TROCHEZ  04/21/2012   Your procedure is scheduled on:  04/28/12 1115am-1220pm  Report to [facility] at 0845 AM.  Call this number if you have problems the morning of surgery: [phone]   Remember:   Do not eat food:After Midnight.  May have clear liquids:until Midnight .  Marland Kitchen  Take these medicines the morning of surgery with A SIP OF WATER:   Do not wear jewelry, make-up or nail polish.  Do not wear lotions, powders, or perfumes.  Do not shave 48 hours prior to surgery  Do not bring valuables to the hospital.  Contacts, dentures or bridgework may not be worn into surgery.  Leave suitcase in the car. After surgery it may be brought to your room.  For patients admitted to the hospital, checkout time is 11:00 AM the day of discharge.      Special Instructions: CHG Shower Use Special Wash: 1/2 bottle night before surgery and 1/2 bottle morning of surgery. shower chin to toes with CHG.  Wash face and private parts with regular soap.    Please read over the following fact sheets that you were given: MRSA Information, Blood Transfusion FAct Sheet, Incentive Spirometry Fact Sheet, coughing and deep breathing exercises, leg exercises

## 2012-04-27 ENCOUNTER — Other Ambulatory Visit: Payer: Self-pay | Admitting: Orthopedic Surgery

## 2012-04-27 NOTE — H&P (Signed)
Annette Murray  DOB: June 08, 1956 Single / Language: [place] / Race: White / Female  Date of Admission:  04/28/2012  Chief complaint:  Right knee pain  History of Present Illness The patient is a 56 year old female who comes in for a preoperative History and Physical. The patient is scheduled for a right total knee arthroplasty to be performed by Dr. Gus Rankin. Aluisio, MD at [facility] on 04/28/2012. The patient is a 56 year old female who presents today for follow up of their knee. The patient is being followed for their right knee pain and osteoarthritis. They are now 3 month(s) out from cortisone injection (helped until approximately 1 month ago). Symptoms reported today include: pain (with weightbearing), pain at night (pressure and burning) and swelling (that increases by the afternoon). The following medication has been used for pain control: antiinflammatory medication (Aleve and Turmeric). The patient indicates that they have questions or concerns today regarding scheduling TKA. Pain is occurring at all times now. It's limiting what she can and cannot do. Injections are helping for a short amount of time each time she has one. When they work, they do well. Unfortunately, she's at a stage now where she feels something more definitive needs to be done due to her significant pain and dysfunction. They have been treated conservatively in the past for the above stated problem and despite conservative measures, they continue to have progressive pain and severe functional limitations and dysfunction. They have failed non-operative management including home exercise, medications, and injections. It is felt that they would benefit from undergoing total joint replacement. Risks and benefits of the procedure have been discussed with the patient and they elect to proceed with surgery. There are no active contraindications to surgery such as ongoing infection or rapidly progressive neurological  disease.  Allergies CODEINE. 04/24/2005 Itching. Vicodin *ANALGESICS - OPIOID*. Itching. Percocet *ANALGESICS - OPIOID*. Itching.   Family History Heart Disease. father Congestive Heart Failure. father Cancer. mother, grandmother mothers side and grandfather mothers side Father. Deceased, Heart disease. age 60 Mother. Deceased, Colon Cancer. age 25   Social History Children. 3 Current work status. 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 never smoker Alcohol use. current drinker; drinks beer and hard liquor; 5-7 per week Drug/Alcohol Rehab (Currently). no Copy of Drug/Alcohol Rehab (Previously). no Living situation. live with spouse Tobacco / smoke exposure. no Pain Contract. no Number of flights of stairs before winded. 2-3 Illicit drug use. no Exercise. Exercises weekly; does running / walking Marital status. married Post-Surgical Plans. Plan is to go home. Advance Directives. Living Will, Healthcare POA   Medication History Valtrex ( Oral) Specific dose unknown - Active. Hydrochlorothiazide ( Oral) Specific dose unknown - Active. Atenolol ( Oral) Specific dose unknown - Active. Adderall ( Oral) Specific dose unknown - Active.   Past Surgical History Arthroscopy of Knee. right; 2005 and 2012 Colon Polyp Removal - Colonoscopy Tonsillectomy. Date: 22. Tubal Ligation. Date: 46. ACL Reconstruction - Right. Date: 2001.   Past Medical History Heart murmur. Mitral Valve Prolaspe Hypertension. Mild Urinary Tract Infection. Past History Menopause   Review of Systems General:Not Present- Chills, Fever, Night Sweats, Fatigue, Weight Gain, Weight Loss and Memory Loss. Skin:Not Present- Hives, Itching, Rash, Eczema and Lesions. HEENT:Not Present- Tinnitus, Headache, Double Vision, Visual Loss, Hearing Loss and Dentures. Respiratory:Not Present- Shortness of breath with exertion, Shortness of  breath at rest, Allergies, Coughing up blood and Chronic Cough. Cardiovascular:Not Present- Chest Pain, Racing/skipping heartbeats, Difficulty  Breathing Lying Down, Murmur, Swelling and Palpitations. Gastrointestinal:Not Present- Bloody Stool, Heartburn, Abdominal Pain, Vomiting, Nausea, Constipation, Diarrhea, Difficulty Swallowing, Jaundice and Loss of appetitie. Female Genitourinary:Not Present- Blood in Urine, Urinary frequency, Weak urinary stream, Discharge, Flank Pain, Incontinence, Painful Urination, Urgency, Urinary Retention and Urinating at Night. Musculoskeletal:Not Present- Muscle Weakness, Muscle Pain, Joint Swelling, Joint Pain, Back Pain, Morning Stiffness and Spasms. Neurological:Not Present- Tremor, Dizziness, Blackout spells, Paralysis, Difficulty with balance and Weakness. Psychiatric:Not Present- Insomnia.   Vitals Weight: 115 lb Height: 62 in Body Surface Area: 1.51 m Body Mass Index: 21.03 kg/m Pulse: 64 (Regular) Resp.: 12 (Unlabored) BP: 132/70 (Sitting, Left Arm, Standard)    Physical Exam The physical exam findings are as follows:   General Mental Status - Alert, cooperative and good historian. General Appearance- pleasant. Not in acute distress. Orientation- Oriented X3. Build & Nutrition- Well nourished and Well developed.   Head and Neck Head- normocephalic, atraumatic . Neck Global Assessment- supple. no bruit auscultated on the right and no bruit auscultated on the left.   Eye Pupil- Bilateral- Regular and Round. Motion- Bilateral- EOMI.   Chest and Lung Exam Auscultation: Breath sounds:- clear at anterior chest wall and - clear at posterior chest wall. Adventitious sounds:- No Adventitious sounds.   Cardiovascular Auscultation:Rhythm- Regular rate and rhythm. Heart Sounds- S1 WNL and S2 WNL. Murmurs & Other Heart Sounds:Auscultation of the heart reveals - No  Murmurs.   Abdomen Palpation/Percussion:Tenderness- Abdomen is non-tender to palpation. Rigidity (guarding)- Abdomen is soft. Auscultation:Auscultation of the abdomen reveals - Bowel sounds normal.   Female Genitourinary Not done, not pertinent to present illness  Musculoskeletal On exam, she's alert and oriented, in no apparent distress. Her left knee shows range about 5-135 with slight crepitus on ROM. No tenderness or instability. Right knee range is about 5-125. Moderate crepitus on ROM. Moderate swelling. Tender medial greater than lateral. No instability. She does have a slight varus deformity.   Radiographs: Radiographs from last fall, and they show that she has severe bone on bone arthritis in the medial and patellofemoral compartments of the right knee with subchondral sclerosis throughout the tibia and femur medially.  Assessment & Plan Osteoarthritis, knee (715.96) Impression: Right Knee  Note: Patient is for a Right Total Knee Repalcement by Dr. Lequita Halt.  Plan is to go home after the hospital stay.  PCP - Dr. Sharlot Gowda - Patient has been seen preoperatively and felt to be stable for surgery. Cards - Dr. Charlton Haws  Signed electronically by Roberts Gaudy, PA-C

## 2012-04-28 ENCOUNTER — Encounter (HOSPITAL_COMMUNITY): Payer: Self-pay | Admitting: Anesthesiology

## 2012-04-28 ENCOUNTER — Encounter (HOSPITAL_COMMUNITY)
Admission: RE | Disposition: A | Discharge status: Home Health | Payer: Self-pay | Source: Ambulatory Visit | Attending: Orthopedic Surgery

## 2012-04-28 ENCOUNTER — Ambulatory Visit (HOSPITAL_COMMUNITY): Payer: BC Managed Care – PPO | Admitting: Anesthesiology

## 2012-04-28 ENCOUNTER — Inpatient Hospital Stay (HOSPITAL_COMMUNITY)
Admission: RE | Admit: 2012-04-28 | Discharge: 2012-05-01 | DRG: 209 | Disposition: A | Discharge status: Home Health | Payer: BC Managed Care – PPO | Source: Ambulatory Visit | Attending: Orthopedic Surgery | Admitting: Orthopedic Surgery

## 2012-04-28 ENCOUNTER — Encounter (HOSPITAL_COMMUNITY): Payer: Self-pay | Admitting: *Deleted

## 2012-04-28 DIAGNOSIS — IMO0002 Reserved for concepts with insufficient information to code with codable children: Principal | ICD-10-CM | POA: Diagnosis present

## 2012-04-28 DIAGNOSIS — D62 Acute posthemorrhagic anemia: Secondary | ICD-10-CM | POA: Diagnosis not present

## 2012-04-28 DIAGNOSIS — F988 Other specified behavioral and emotional disorders with onset usually occurring in childhood and adolescence: Secondary | ICD-10-CM | POA: Diagnosis present

## 2012-04-28 DIAGNOSIS — I10 Essential (primary) hypertension: Secondary | ICD-10-CM | POA: Diagnosis present

## 2012-04-28 DIAGNOSIS — Z96659 Presence of unspecified artificial knee joint: Secondary | ICD-10-CM | POA: Diagnosis present

## 2012-04-28 DIAGNOSIS — Z79899 Other long term (current) drug therapy: Secondary | ICD-10-CM

## 2012-04-28 DIAGNOSIS — R011 Cardiac murmur, unspecified: Secondary | ICD-10-CM | POA: Diagnosis present

## 2012-04-28 DIAGNOSIS — M25569 Pain in unspecified knee: Secondary | ICD-10-CM | POA: Diagnosis present

## 2012-04-28 DIAGNOSIS — M1710 Unilateral primary osteoarthritis, unspecified knee: Principal | ICD-10-CM | POA: Diagnosis present

## 2012-04-28 DIAGNOSIS — I059 Rheumatic mitral valve disease, unspecified: Secondary | ICD-10-CM | POA: Diagnosis present

## 2012-04-28 HISTORY — PX: TOTAL KNEE ARTHROPLASTY: SHX125

## 2012-04-28 LAB — TYPE AND SCREEN
ABO/RH(D): O POS
Antibody Screen: NEGATIVE

## 2012-04-28 LAB — PROTIME-INR: Prothrombin Time: 13.7 seconds (ref 11.6–15.2)

## 2012-04-28 SURGERY — ARTHROPLASTY, KNEE, TOTAL
Anesthesia: General | Site: Knee | Laterality: Right | Wound class: Clean

## 2012-04-28 MED ORDER — METHOCARBAMOL 500 MG PO TABS
500.0000 mg | ORAL_TABLET | Freq: Four times a day (QID) | ORAL | Status: DC | PRN
  Administered 2012-04-29 – 2012-04-30 (×5): 500 mg via ORAL
  Filled 2012-04-28 (×6): qty 1

## 2012-04-28 MED ORDER — DIPHENHYDRAMINE HCL 50 MG/ML IJ SOLN
12.5000 mg | Freq: Four times a day (QID) | INTRAMUSCULAR | Status: DC | PRN
  Administered 2012-05-01: 12.5 mg via INTRAVENOUS
  Filled 2012-04-28: qty 1

## 2012-04-28 MED ORDER — BUPIVACAINE HCL 0.75 % IJ SOLN
INTRAMUSCULAR | Status: DC | PRN
  Administered 2012-04-28: 15 mg via INTRATHECAL

## 2012-04-28 MED ORDER — HYDROCHLOROTHIAZIDE 12.5 MG PO CAPS
12.5000 mg | ORAL_CAPSULE | Freq: Every morning | ORAL | Status: DC
  Administered 2012-04-28 – 2012-05-01 (×4): 12.5 mg via ORAL
  Filled 2012-04-28 (×4): qty 1

## 2012-04-28 MED ORDER — MORPHINE SULFATE (PF) 1 MG/ML IV SOLN
INTRAVENOUS | Status: DC
  Administered 2012-04-28 (×2): 1 mg via INTRAVENOUS
  Administered 2012-04-29: 8.88 mg via INTRAVENOUS
  Filled 2012-04-28: qty 25

## 2012-04-28 MED ORDER — BUPIVACAINE 0.25 % ON-Q PUMP SINGLE CATH 300ML: 300.0000 mL | INJECTION | Status: DC

## 2012-04-28 MED ORDER — CEFAZOLIN SODIUM-DEXTROSE 2-3 GM-% IV SOLR
2.0000 g | INTRAVENOUS | Status: AC
  Administered 2012-04-28: 2 g via INTRAVENOUS

## 2012-04-28 MED ORDER — PROPOFOL 10 MG/ML IV EMUL
INTRAVENOUS | Status: DC | PRN
  Administered 2012-04-28: 140 ug/kg/min via INTRAVENOUS

## 2012-04-28 MED ORDER — METHOCARBAMOL 100 MG/ML IJ SOLN
500.0000 mg | Freq: Four times a day (QID) | INTRAVENOUS | Status: DC | PRN
  Administered 2012-04-28: 500 mg via INTRAVENOUS
  Filled 2012-04-28: qty 5

## 2012-04-28 MED ORDER — EPHEDRINE SULFATE 50 MG/ML IJ SOLN
INTRAMUSCULAR | Status: DC | PRN
  Administered 2012-04-28: 10 mg via INTRAVENOUS
  Administered 2012-04-28: 5 mg via INTRAVENOUS
  Administered 2012-04-28: 10 mg via INTRAVENOUS

## 2012-04-28 MED ORDER — BISACODYL 10 MG RE SUPP: 10.0000 mg | Freq: Every day | RECTAL | Status: DC | PRN

## 2012-04-28 MED ORDER — ONDANSETRON HCL 4 MG/2ML IJ SOLN
INTRAMUSCULAR | Status: DC | PRN
  Administered 2012-04-28: 4 mg via INTRAVENOUS

## 2012-04-28 MED ORDER — ONDANSETRON HCL 4 MG PO TABS: 4.0000 mg | ORAL_TABLET | Freq: Four times a day (QID) | ORAL | Status: DC | PRN

## 2012-04-28 MED ORDER — SODIUM CHLORIDE 0.9 % IV SOLN: INTRAVENOUS | Status: DC

## 2012-04-28 MED ORDER — PHENOL 1.4 % MT LIQD
1.0000 | OROMUCOSAL | Status: DC | PRN
  Filled 2012-04-28: qty 177

## 2012-04-28 MED ORDER — OXYCODONE HCL 5 MG PO TABS
5.0000 mg | ORAL_TABLET | ORAL | Status: DC | PRN
Start: 2012-04-28 — End: 2012-05-01
  Administered 2012-04-28 – 2012-04-29 (×3): 10 mg via ORAL
  Administered 2012-04-29: 5 mg via ORAL
  Administered 2012-04-29: 10 mg via ORAL
  Administered 2012-04-29: 5 mg via ORAL
  Administered 2012-04-30 – 2012-05-01 (×9): 10 mg via ORAL
  Filled 2012-04-28: qty 2
  Filled 2012-04-28: qty 1
  Filled 2012-04-28 (×11): qty 2
  Filled 2012-04-28: qty 1
  Filled 2012-04-28 (×2): qty 2

## 2012-04-28 MED ORDER — METOCLOPRAMIDE HCL 10 MG PO TABS: 5.0000 mg | ORAL_TABLET | Freq: Three times a day (TID) | ORAL | Status: DC | PRN

## 2012-04-28 MED ORDER — DEXAMETHASONE SODIUM PHOSPHATE 10 MG/ML IJ SOLN
10.0000 mg | Freq: Once | INTRAMUSCULAR | Status: AC
  Administered 2012-04-28: 10 mg via INTRAVENOUS

## 2012-04-28 MED ORDER — LACTATED RINGERS IV SOLN
INTRAVENOUS | Status: DC | PRN
  Administered 2012-04-28 (×3): via INTRAVENOUS

## 2012-04-28 MED ORDER — ACETAMINOPHEN 10 MG/ML IV SOLN
INTRAVENOUS | Status: AC
  Filled 2012-04-28: qty 100

## 2012-04-28 MED ORDER — ONDANSETRON HCL 4 MG/2ML IJ SOLN: 4.0000 mg | Freq: Four times a day (QID) | INTRAMUSCULAR | Status: DC | PRN

## 2012-04-28 MED ORDER — SODIUM CHLORIDE 0.9 % IJ SOLN: 9.0000 mL | INTRAMUSCULAR | Status: DC | PRN

## 2012-04-28 MED ORDER — SODIUM CHLORIDE 0.9 % IV SOLN
INTRAVENOUS | Status: DC
  Administered 2012-04-28: 16:00:00 via INTRAVENOUS
  Administered 2012-04-29: 20 mL/h via INTRAVENOUS
  Administered 2012-04-29: 06:00:00 via INTRAVENOUS

## 2012-04-28 MED ORDER — BUPIVACAINE ON-Q PAIN PUMP (FOR ORDER SET NO CHG)
INJECTION | Status: DC
  Filled 2012-04-28: qty 1

## 2012-04-28 MED ORDER — BUPIVACAINE 0.25 % ON-Q PUMP SINGLE CATH 300ML
INJECTION | Status: AC
  Filled 2012-04-28: qty 300

## 2012-04-28 MED ORDER — FLEET ENEMA 7-19 GM/118ML RE ENEM: 1.0000 | ENEMA | Freq: Once | RECTAL | Status: AC | PRN

## 2012-04-28 MED ORDER — MORPHINE SULFATE (PF) 1 MG/ML IV SOLN
INTRAVENOUS | Status: AC
  Filled 2012-04-28: qty 25

## 2012-04-28 MED ORDER — HYDROQUINONE 4 % EX CREA
1.0000 "application " | TOPICAL_CREAM | Freq: Two times a day (BID) | CUTANEOUS | Status: DC
  Administered 2012-04-28 – 2012-04-30 (×2): 1 via TOPICAL

## 2012-04-28 MED ORDER — ACETAMINOPHEN 10 MG/ML IV SOLN
1000.0000 mg | Freq: Once | INTRAVENOUS | Status: AC
  Administered 2012-04-28: 1000 mg via INTRAVENOUS
  Filled 2012-04-28: qty 100

## 2012-04-28 MED ORDER — AMPHETAMINE-DEXTROAMPHET ER 5 MG PO CP24
15.0000 mg | ORAL_CAPSULE | Freq: Every day | ORAL | Status: DC
  Administered 2012-04-29: 15 mg via ORAL
  Filled 2012-04-28: qty 3
  Filled 2012-04-28: qty 2
  Filled 2012-04-28 (×2): qty 1

## 2012-04-28 MED ORDER — MENTHOL 3 MG MT LOZG
1.0000 | LOZENGE | OROMUCOSAL | Status: DC | PRN
  Filled 2012-04-28: qty 9

## 2012-04-28 MED ORDER — CHLORHEXIDINE GLUCONATE 4 % EX LIQD
60.0000 mL | Freq: Once | CUTANEOUS | Status: DC
  Filled 2012-04-28: qty 60

## 2012-04-28 MED ORDER — RIVAROXABAN 10 MG PO TABS
10.0000 mg | ORAL_TABLET | Freq: Every day | ORAL | Status: DC
  Administered 2012-04-29 – 2012-05-01 (×3): 10 mg via ORAL
  Filled 2012-04-28 (×4): qty 1

## 2012-04-28 MED ORDER — DIPHENHYDRAMINE HCL 12.5 MG/5ML PO ELIX
12.5000 mg | ORAL_SOLUTION | Freq: Four times a day (QID) | ORAL | Status: DC | PRN
  Filled 2012-04-28: qty 5

## 2012-04-28 MED ORDER — TRAMADOL HCL 50 MG PO TABS
50.0000 mg | ORAL_TABLET | Freq: Four times a day (QID) | ORAL | Status: DC | PRN
  Administered 2012-04-30: 100 mg via ORAL
  Filled 2012-04-28: qty 2

## 2012-04-28 MED ORDER — 0.9 % SODIUM CHLORIDE (POUR BTL) OPTIME
TOPICAL | Status: DC | PRN
  Administered 2012-04-28: 1000 mL

## 2012-04-28 MED ORDER — ACETAMINOPHEN 650 MG RE SUPP: 650.0000 mg | Freq: Four times a day (QID) | RECTAL | Status: DC | PRN

## 2012-04-28 MED ORDER — TRAZODONE HCL 50 MG PO TABS
50.0000 mg | ORAL_TABLET | Freq: Every day | ORAL | Status: DC
  Administered 2012-04-28 – 2012-04-30 (×3): 50 mg via ORAL
  Filled 2012-04-28 (×4): qty 1

## 2012-04-28 MED ORDER — DIPHENHYDRAMINE HCL 12.5 MG/5ML PO ELIX
12.5000 mg | ORAL_SOLUTION | ORAL | Status: DC | PRN
  Administered 2012-04-29: 12.5 mg via ORAL
  Filled 2012-04-28: qty 5

## 2012-04-28 MED ORDER — METOCLOPRAMIDE HCL 5 MG/ML IJ SOLN: 5.0000 mg | Freq: Three times a day (TID) | INTRAMUSCULAR | Status: DC | PRN

## 2012-04-28 MED ORDER — ACETAMINOPHEN 325 MG PO TABS: 650.0000 mg | ORAL_TABLET | Freq: Four times a day (QID) | ORAL | Status: DC | PRN

## 2012-04-28 MED ORDER — SODIUM CHLORIDE 0.9 % IR SOLN
Status: DC | PRN
  Administered 2012-04-28: 3000 mL

## 2012-04-28 MED ORDER — LACTATED RINGERS IV SOLN: INTRAVENOUS | Status: DC

## 2012-04-28 MED ORDER — POLYETHYLENE GLYCOL 3350 17 G PO PACK: 17.0000 g | PACK | Freq: Every day | ORAL | Status: DC | PRN

## 2012-04-28 MED ORDER — BUPIVACAINE HCL (PF) 0.25 % IJ SOLN
INTRAMUSCULAR | Status: DC | PRN
  Administered 2012-04-28: 11:00:00

## 2012-04-28 MED ORDER — CEFAZOLIN SODIUM-DEXTROSE 2-3 GM-% IV SOLR
INTRAVENOUS | Status: AC
  Filled 2012-04-28: qty 50

## 2012-04-28 MED ORDER — ACETAMINOPHEN 10 MG/ML IV SOLN
1000.0000 mg | Freq: Four times a day (QID) | INTRAVENOUS | Status: AC
  Administered 2012-04-28 – 2012-04-29 (×4): 1000 mg via INTRAVENOUS
  Filled 2012-04-28 (×6): qty 100

## 2012-04-28 MED ORDER — ATENOLOL 25 MG PO TABS
25.0000 mg | ORAL_TABLET | Freq: Every morning | ORAL | Status: DC
  Administered 2012-04-29 – 2012-05-01 (×3): 25 mg via ORAL
  Filled 2012-04-28 (×3): qty 1

## 2012-04-28 MED ORDER — PHENYLEPHRINE HCL 10 MG/ML IJ SOLN
INTRAMUSCULAR | Status: DC | PRN
Start: 2012-04-28 — End: 2012-04-28
  Administered 2012-04-28: 100 ug via INTRAVENOUS

## 2012-04-28 MED ORDER — FENTANYL CITRATE 0.05 MG/ML IJ SOLN
INTRAMUSCULAR | Status: DC | PRN
  Administered 2012-04-28 (×2): 50 ug via INTRAVENOUS

## 2012-04-28 MED ORDER — VALACYCLOVIR HCL 500 MG PO TABS
500.0000 mg | ORAL_TABLET | Freq: Every morning | ORAL | Status: DC
  Administered 2012-04-28 – 2012-05-01 (×4): 500 mg via ORAL
  Filled 2012-04-28 (×4): qty 1

## 2012-04-28 MED ORDER — TEMAZEPAM 15 MG PO CAPS: 15.0000 mg | ORAL_CAPSULE | Freq: Every evening | ORAL | Status: DC | PRN

## 2012-04-28 MED ORDER — HYDROMORPHONE HCL PF 1 MG/ML IJ SOLN: 0.2500 mg | INTRAMUSCULAR | Status: DC | PRN

## 2012-04-28 MED ORDER — MIDAZOLAM HCL 5 MG/5ML IJ SOLN
INTRAMUSCULAR | Status: DC | PRN
  Administered 2012-04-28 (×2): 1 mg via INTRAVENOUS

## 2012-04-28 MED ORDER — DOCUSATE SODIUM 100 MG PO CAPS
100.0000 mg | ORAL_CAPSULE | Freq: Two times a day (BID) | ORAL | Status: DC
  Administered 2012-04-28 – 2012-05-01 (×6): 100 mg via ORAL

## 2012-04-28 MED ORDER — NALOXONE HCL 0.4 MG/ML IJ SOLN: 0.4000 mg | INTRAMUSCULAR | Status: DC | PRN

## 2012-04-28 SURGICAL SUPPLY — 56 items
BAG SPEC THK2 15X12 ZIP CLS (MISCELLANEOUS) ×1
BAG ZIPLOCK 12X15 (MISCELLANEOUS) ×2 IMPLANT
BANDAGE ELASTIC 4 VELCRO ST LF (GAUZE/BANDAGES/DRESSINGS) ×1 IMPLANT
BANDAGE ELASTIC 6 VELCRO ST LF (GAUZE/BANDAGES/DRESSINGS) ×2 IMPLANT
BANDAGE ESMARK 6X9 LF (GAUZE/BANDAGES/DRESSINGS) ×1 IMPLANT
BLADE SAG 18X100X1.27 (BLADE) ×2 IMPLANT
BLADE SAW SGTL 11.0X1.19X90.0M (BLADE) ×2 IMPLANT
BNDG CMPR 9X6 STRL LF SNTH (GAUZE/BANDAGES/DRESSINGS) ×1
BNDG ESMARK 6X9 LF (GAUZE/BANDAGES/DRESSINGS) ×2
BOWL SMART MIX CTS (DISPOSABLE) ×2 IMPLANT
CATH KIT ON-Q SILVERSOAK 5 (CATHETERS) ×1 IMPLANT
CATH KIT ON-Q SILVERSOAK 5IN (CATHETERS) ×2 IMPLANT
CEMENT HV SMART SET (Cement) ×4 IMPLANT
CLOTH BEACON ORANGE TIMEOUT ST (SAFETY) ×2 IMPLANT
CLSR STERI-STRIP ANTIMIC 1/2X4 (GAUZE/BANDAGES/DRESSINGS) ×1 IMPLANT
CUFF TOURN SGL QUICK 34 (TOURNIQUET CUFF) ×2
CUFF TRNQT CYL 34X4X40X1 (TOURNIQUET CUFF) ×1 IMPLANT
DRAPE EXTREMITY T 121X128X90 (DRAPE) ×2 IMPLANT
DRAPE POUCH INSTRU U-SHP 10X18 (DRAPES) ×2 IMPLANT
DRAPE U-SHAPE 47X51 STRL (DRAPES) ×2 IMPLANT
DRSG ADAPTIC 3X8 NADH LF (GAUZE/BANDAGES/DRESSINGS) ×2 IMPLANT
DRSG PAD ABDOMINAL 8X10 ST (GAUZE/BANDAGES/DRESSINGS) ×1 IMPLANT
DURAPREP 26ML APPLICATOR (WOUND CARE) ×2 IMPLANT
ELECT REM PT RETURN 9FT ADLT (ELECTROSURGICAL) ×2
ELECTRODE REM PT RTRN 9FT ADLT (ELECTROSURGICAL) ×1 IMPLANT
EVACUATOR 1/8 PVC DRAIN (DRAIN) ×2 IMPLANT
FACESHIELD LNG OPTICON STERILE (SAFETY) ×10 IMPLANT
GLOVE BIO SURGEON STRL SZ7.5 (GLOVE) ×2 IMPLANT
GLOVE BIO SURGEON STRL SZ8 (GLOVE) ×2 IMPLANT
GLOVE BIOGEL PI IND STRL 8 (GLOVE) ×2 IMPLANT
GLOVE BIOGEL PI INDICATOR 8 (GLOVE) ×2
GOWN STRL NON-REIN LRG LVL3 (GOWN DISPOSABLE) ×2 IMPLANT
GOWN STRL REIN XL XLG (GOWN DISPOSABLE) ×2 IMPLANT
HANDPIECE INTERPULSE COAX TIP (DISPOSABLE) ×2
IMMOBILIZER KNEE 20 (SOFTGOODS) ×2
IMMOBILIZER KNEE 20 THIGH 36 (SOFTGOODS) ×1 IMPLANT
KIT BASIN OR (CUSTOM PROCEDURE TRAY) ×2 IMPLANT
MANIFOLD NEPTUNE II (INSTRUMENTS) ×2 IMPLANT
NS IRRIG 1000ML POUR BTL (IV SOLUTION) ×2 IMPLANT
PACK TOTAL JOINT (CUSTOM PROCEDURE TRAY) ×2 IMPLANT
PAD ABD 7.5X8 STRL (GAUZE/BANDAGES/DRESSINGS) ×2 IMPLANT
PADDING CAST COTTON 6X4 STRL (CAST SUPPLIES) ×4 IMPLANT
POSITIONER SURGICAL ARM (MISCELLANEOUS) ×2 IMPLANT
SET HNDPC FAN SPRY TIP SCT (DISPOSABLE) ×1 IMPLANT
SPONGE GAUZE 4X4 12PLY (GAUZE/BANDAGES/DRESSINGS) ×2 IMPLANT
STRIP CLOSURE SKIN 1/2X4 (GAUZE/BANDAGES/DRESSINGS) ×4 IMPLANT
SUCTION FRAZIER 12FR DISP (SUCTIONS) ×2 IMPLANT
SUT MNCRL AB 4-0 PS2 18 (SUTURE) ×2 IMPLANT
SUT PDS AB 1 CT1 27 (SUTURE) ×6 IMPLANT
SUT VIC AB 2-0 CT1 27 (SUTURE) ×6
SUT VIC AB 2-0 CT1 TAPERPNT 27 (SUTURE) ×3 IMPLANT
SUT VLOC 180 0 24IN GS25 (SUTURE) ×2 IMPLANT
TOWEL OR 17X26 10 PK STRL BLUE (TOWEL DISPOSABLE) ×4 IMPLANT
TRAY FOLEY CATH 14FRSI W/METER (CATHETERS) ×2 IMPLANT
WATER STERILE IRR 1500ML POUR (IV SOLUTION) ×2 IMPLANT
WRAP KNEE MAXI GEL POST OP (GAUZE/BANDAGES/DRESSINGS) ×4 IMPLANT

## 2012-04-28 NOTE — Anesthesia Preprocedure Evaluation (Addendum)
Anesthesia Evaluation  Patient identified by MRN, date of birth, ID band Patient awake    Reviewed: Allergy & Precautions, H&P , NPO status , Patient's Chart, lab work & pertinent test results, reviewed documented beta blocker date and time   Airway Mallampati: II TM Distance: >3 FB Neck ROM: full    Dental No notable dental hx. (+) Teeth Intact and Dental Advisory Given   Pulmonary neg pulmonary ROS,  breath sounds clear to auscultation  Pulmonary exam normal       Cardiovascular Exercise Tolerance: Good hypertension, Pt. on home beta blockers negative cardio ROS  + Valvular Problems/Murmurs MVP Rhythm:regular Rate:Normal     Neuro/Psych negative neurological ROS  negative psych ROS   GI/Hepatic negative GI ROS, Neg liver ROS,   Endo/Other  negative endocrine ROS  Renal/GU negative Renal ROS  negative genitourinary   Musculoskeletal   Abdominal   Peds  Hematology negative hematology ROS (+)   Anesthesia Other Findings   Reproductive/Obstetrics negative OB ROS                           Anesthesia Physical Anesthesia Plan  ASA: II  Anesthesia Plan: Spinal   Post-op Pain Management:    Induction:   Airway Management Planned: Simple Face Mask  Additional Equipment:   Intra-op Plan:   Post-operative Plan:   Informed Consent: I have reviewed the patients History and Physical, chart, labs and discussed the procedure including the risks, benefits and alternatives for the proposed anesthesia with the patient or authorized representative who has indicated his/her understanding and acceptance.   Dental Advisory Given  Plan Discussed with: CRNA and Surgeon  Anesthesia Plan Comments:        Anesthesia Quick Evaluation

## 2012-04-28 NOTE — Anesthesia Postprocedure Evaluation (Signed)
  Anesthesia Post-op Note  Patient: Annette Murray  Procedure(s) Performed: Procedure(s) (LRB): TOTAL KNEE ARTHROPLASTY (Right)  Patient Location: PACU  Anesthesia Type: Spinal  Level of Consciousness: awake and alert   Airway and Oxygen Therapy: Patient Spontanous Breathing  Post-op Pain: mild  Post-op Assessment: Post-op Vital signs reviewed, Patient's Cardiovascular Status Stable, Respiratory Function Stable, Patent Airway and No signs of Nausea or vomiting  Post-op Vital Signs: stable  Complications: No apparent anesthesia complications

## 2012-04-28 NOTE — Transfer of Care (Signed)
Immediate Anesthesia Transfer of Care Note  Patient: Annette Murray  Procedure(s) Performed: Procedure(s) (LRB): TOTAL KNEE ARTHROPLASTY (Right)  Patient Location: PACU  Anesthesia Type: Spinal  Level of Consciousness: awake, alert , oriented and patient cooperative  Airway & Oxygen Therapy: Patient Spontanous Breathing and Patient connected to face mask oxygen  Post-op Assessment: Report given to PACU RN and Post -op Vital signs reviewed and stable  Post vital signs: Reviewed and stable  Complications: No apparent anesthesia complications

## 2012-04-28 NOTE — Preoperative (Signed)
Beta Blockers   Reason not to administer Beta Blockers:Not Applicable 

## 2012-04-28 NOTE — Interval H&P Note (Signed)
History and Physical Interval Note:  04/28/2012 11:23 AM  Annette Murray  has presented today for surgery, with the diagnosis of Osteoarthritis of the right knee  The various methods of treatment have been discussed with the patient and family. After consideration of risks, benefits and other options for treatment, the patient has consented to  Procedure(s) (LRB): TOTAL KNEE ARTHROPLASTY (Right) as a surgical intervention .  The patient's history has been reviewed, patient examined, no change in status, stable for surgery.  I have reviewed the patient's chart and labs.  Questions were answered to the patient's satisfaction.     Loanne Drilling

## 2012-04-28 NOTE — Op Note (Signed)
Pre-operative diagnosis- Osteoarthritis  Right knee(s)  Post-operative diagnosis- Osteoarthritis Right knee(s)  Procedure-  Right  Total Knee Arthroplasty  Surgeon- Gus Rankin. Nyeshia Mysliwiec, MD  Assistant- Dimitri Ped, PA-C   Anesthesia-  Spinal EBL-* No blood loss amount entered *  Drains Hemovac  Tourniquet time-  Total Tourniquet Time Documented: Thigh (Right) - 38 minutes   Complications- None  Condition-PACU - hemodynamically stable.   Brief Clinical Note  Annette Murray is a 56 y.o. year old female with end stage OA of her right knee with progressively worsening pain and dysfunction. She has constant pain, with activity and at rest and significant functional deficits with difficulties even with ADLs. She has had extensive non-op management including analgesics, injections of cortisone and viscosupplements, and home exercise program, but remains in significant pain with significant dysfunction.Radiographs show bone on bone arthritis medial and patellofemoral. She presents now for rightt Total Knee Arthroplasty.    Procedure in detail---   The patient is brought into the operating room and positioned supine on the operating table. After successful administration of  Spinal,   a tourniquet is placed high on the  Right thigh(s) and the lower extremity is prepped and draped in the usual sterile fashion. Time out is performed by the operating team and then the  Right lower extremity is wrapped in Esmarch, knee flexed and the tourniquet inflated to 300 mmHg.       A midline incision is made with a ten blade through the subcutaneous tissue to the level of the extensor mechanism. A fresh blade is used to make a medial parapatellar arthrotomy. Soft tissue over the proximal medial tibia is subperiosteally elevated to the joint line with a knife and into the semimembranosus bursa with a Cobb elevator. Soft tissue over the proximal lateral tibia is elevated with attention being paid to avoiding the  patellar tendon on the tibial tubercle. The patella is everted, knee flexed 90 degrees and the ACL and PCL are removed. Findings are bone on bone medial and patellofemoral with large medial osteophytes.        The drill is used to create a starting hole in the distal femur and the canal is thoroughly irrigated with sterile saline to remove the fatty contents. The 5 degree Right  valgus alignment guide is placed into the femoral canal and the distal femoral cutting block is pinned to remove 10 mm off the distal femur. Resection is made with an oscillating saw.      The tibia is subluxed forward and the menisci are removed. The extramedullary alignment guide is placed referencing proximally at the medial aspect of the tibial tubercle and distally along the second metatarsal axis and tibial crest. The block is pinned to remove 2mm off the more deficient medial  side. Resection is made with an oscillating saw. Size 2.5is the most appropriate size for the tibia and the proximal tibia is prepared with the modular drill and keel punch for that size.      The femoral sizing guide is placed and size 3 is most appropriate. Rotation is marked off the epicondylar axis and confirmed by creating a rectangular flexion gap at 90 degrees. The size 3 cutting block is pinned in this rotation and the anterior, posterior and chamfer cuts are made with the oscillating saw. The intercondylar block is then placed and that cut is made.      Trial size 2.5 tibial component, trial size 3 posterior stabilized femur and a 12.5  mm  posterior stabilized rotating platform insert trial is placed. Full extension is achieved with excellent varus/valgus and anterior/posterior balance throughout full range of motion. The patella is everted and thickness measured to be 22  mm. Free hand resection is taken to 12 mm, a 35 template is placed, lug holes are drilled, trial patella is placed, and it tracks normally. Osteophytes are removed off the  posterior femur with the trial in place. All trials are removed and the cut bone surfaces prepared with pulsatile lavage. Cement is mixed and once ready for implantation, the size 2.5 tibial implant, size  3 posterior stabilized femoral component, and the size 35 patella are cemented in place and the patella is held with the clamp. The trial insert is placed and the knee held in full extension. All extruded cement is removed and once the cement is hard the permanent 12.5 mm posterior stabilized rotating platform insert is placed into the tibial tray.      The wound is copiously irrigated with saline solution and the extensor mechanism closed over a hemovac drain with #1 PDS suture. The tourniquet is released for a total tourniquet time of 37  minutes. Flexion against gravity is 140 degrees and the patella tracks normally. Subcutaneous tissue is closed with 2.0 vicryl and subcuticular with running 4.0 Monocryl. The catheter for the Marcaine pain pump is placed and the pump is initiated. The incision is cleaned and dried and steri-strips and a bulky sterile dressing are applied. The limb is placed into a knee immobilizer and the patient is awakened and transported to recovery in stable condition.      Please note that a surgical assistant was a medical necessity for this procedure in order to perform it in a safe and expeditious manner. Surgical assistant was necessary to retract the ligaments and vital neurovascular structures to prevent injury to them and also necessary for proper positioning of the limb to allow for anatomic placement of the prosthesis.   Gus Rankin Asli Tokarski, MD    04/28/2012, 12:43 PM

## 2012-04-28 NOTE — H&P (View-Only) (Signed)
Annette Murray  DOB: 04/21/1956 Single / Language: English / Race: White / Female  Date of Admission:  04/28/2012  Chief complaint:  Right knee pain  History of Present Illness The patient is a 56 year old female who comes in for a preoperative History and Physical. The patient is scheduled for a right total knee arthroplasty to be performed by Dr. Frank V. Aluisio, MD at [facility] on 04/28/2012. The patient is a 55 year old female who presents today for follow up of their knee. The patient is being followed for their right knee pain and osteoarthritis. They are now 3 month(s) out from cortisone injection (helped until approximately 1 month ago). Symptoms reported today include: pain (with weightbearing), pain at night (pressure and burning) and swelling (that increases by the afternoon). The following medication has been used for pain control: antiinflammatory medication (Aleve and Turmeric). The patient indicates that they have questions or concerns today regarding scheduling TKA. Pain is occurring at all times now. It's limiting what she can and cannot do. Injections are helping for a short amount of time each time she has one. When they work, they do well. Unfortunately, she's at a stage now where she feels something more definitive needs to be done due to her significant pain and dysfunction. They have been treated conservatively in the past for the above stated problem and despite conservative measures, they continue to have progressive pain and severe functional limitations and dysfunction. They have failed non-operative management including home exercise, medications, and injections. It is felt that they would benefit from undergoing total joint replacement. Risks and benefits of the procedure have been discussed with the patient and they elect to proceed with surgery. There are no active contraindications to surgery such as ongoing infection or rapidly progressive neurological  disease.  Allergies CODEINE. 04/24/2005 Itching. Vicodin *ANALGESICS - OPIOID*. Itching. Percocet *ANALGESICS - OPIOID*. Itching.   Family History Heart Disease. father Congestive Heart Failure. father Cancer. mother, grandmother mothers side and grandfather mothers side Father. Deceased, Heart disease. age 78 Mother. Deceased, Colon Cancer. age 57   Social History Children. 3 Current work status. 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 never smoker Alcohol use. current drinker; drinks beer and hard liquor; 5-7 per week Drug/Alcohol Rehab (Currently). no Copy of Drug/Alcohol Rehab (Previously). no Living situation. live with spouse Tobacco / smoke exposure. no Pain Contract. no Number of flights of stairs before winded. 2-3 Illicit drug use. no Exercise. Exercises weekly; does running / walking Marital status. married Post-Surgical Plans. Plan is to go home. Advance Directives. Living Will, Healthcare POA   Medication History Valtrex ( Oral) Specific dose unknown - Active. Hydrochlorothiazide ( Oral) Specific dose unknown - Active. Atenolol ( Oral) Specific dose unknown - Active. Adderall ( Oral) Specific dose unknown - Active.   Past Surgical History Arthroscopy of Knee. right; 2005 and 2012 Colon Polyp Removal - Colonoscopy Tonsillectomy. Date: 1974. Tubal Ligation. Date: 1987. ACL Reconstruction - Right. Date: 2001.   Past Medical History Heart murmur. Mitral Valve Prolaspe Hypertension. Mild Urinary Tract Infection. Past History Menopause   Review of Systems General:Not Present- Chills, Fever, Night Sweats, Fatigue, Weight Gain, Weight Loss and Memory Loss. Skin:Not Present- Hives, Itching, Rash, Eczema and Lesions. HEENT:Not Present- Tinnitus, Headache, Double Vision, Visual Loss, Hearing Loss and Dentures. Respiratory:Not Present- Shortness of breath with exertion, Shortness of  breath at rest, Allergies, Coughing up blood and Chronic Cough. Cardiovascular:Not Present- Chest Pain, Racing/skipping heartbeats, Difficulty   Breathing Lying Down, Murmur, Swelling and Palpitations. Gastrointestinal:Not Present- Bloody Stool, Heartburn, Abdominal Pain, Vomiting, Nausea, Constipation, Diarrhea, Difficulty Swallowing, Jaundice and Loss of appetitie. Female Genitourinary:Not Present- Blood in Urine, Urinary frequency, Weak urinary stream, Discharge, Flank Pain, Incontinence, Painful Urination, Urgency, Urinary Retention and Urinating at Night. Musculoskeletal:Not Present- Muscle Weakness, Muscle Pain, Joint Swelling, Joint Pain, Back Pain, Morning Stiffness and Spasms. Neurological:Not Present- Tremor, Dizziness, Blackout spells, Paralysis, Difficulty with balance and Weakness. Psychiatric:Not Present- Insomnia.   Vitals Weight: 115 lb Height: 62 in Body Surface Area: 1.51 m Body Mass Index: 21.03 kg/m Pulse: 64 (Regular) Resp.: 12 (Unlabored) BP: 132/70 (Sitting, Left Arm, Standard)    Physical Exam The physical exam findings are as follows:   General Mental Status - Alert, cooperative and good historian. General Appearance- pleasant. Not in acute distress. Orientation- Oriented X3. Build & Nutrition- Well nourished and Well developed.   Head and Neck Head- normocephalic, atraumatic . Neck Global Assessment- supple. no bruit auscultated on the right and no bruit auscultated on the left.   Eye Pupil- Bilateral- Regular and Round. Motion- Bilateral- EOMI.   Chest and Lung Exam Auscultation: Breath sounds:- clear at anterior chest wall and - clear at posterior chest wall. Adventitious sounds:- No Adventitious sounds.   Cardiovascular Auscultation:Rhythm- Regular rate and rhythm. Heart Sounds- S1 WNL and S2 WNL. Murmurs & Other Heart Sounds:Auscultation of the heart reveals - No  Murmurs.   Abdomen Palpation/Percussion:Tenderness- Abdomen is non-tender to palpation. Rigidity (guarding)- Abdomen is soft. Auscultation:Auscultation of the abdomen reveals - Bowel sounds normal.   Female Genitourinary Not done, not pertinent to present illness  Musculoskeletal On exam, she's alert and oriented, in no apparent distress. Her left knee shows range about 5-135 with slight crepitus on ROM. No tenderness or instability. Right knee range is about 5-125. Moderate crepitus on ROM. Moderate swelling. Tender medial greater than lateral. No instability. She does have a slight varus deformity.   Radiographs: Radiographs from last fall, and they show that she has severe bone on bone arthritis in the medial and patellofemoral compartments of the right knee with subchondral sclerosis throughout the tibia and femur medially.  Assessment & Plan Osteoarthritis, knee (715.96) Impression: Right Knee  Note: Patient is for a Right Total Knee Repalcement by Dr. Aluisio.  Plan is to go home after the hospital stay.  PCP - Dr. John Lalonde - Patient has been seen preoperatively and felt to be stable for surgery. Cards - Dr. Peter Nishan  Signed electronically by DREW L PERKINS, PA-C  

## 2012-04-28 NOTE — Anesthesia Procedure Notes (Addendum)
Anesthesia Regional Block:   Narrative:    Spinal  Patient location during procedure: OR Start time: 04/28/2012 11:35 AM End time: 04/28/2012 11:40 AM Staffing Anesthesiologist: Ronelle Nigh L Performed by: anesthesiologist  Preanesthetic Checklist Completed: patient identified, site marked, surgical consent, pre-op evaluation, timeout performed, IV checked, risks and benefits discussed and monitors and equipment checked Spinal Block Patient position: sitting Prep: Betadine Patient monitoring: heart rate, continuous pulse ox and blood pressure Injection technique: single-shot Needle Needle type: Spinocan  Needle gauge: 22 G Needle length: 9 cm Assessment Sensory level: T6 Additional Notes Expiration date of kit checked and confirmed. Patient tolerated procedure well, without complications.

## 2012-04-29 LAB — CBC
MCH: 32 pg (ref 26.0–34.0)
MCHC: 34.3 g/dL (ref 30.0–36.0)
MCV: 93.4 fL (ref 78.0–100.0)
Platelets: 190 10*3/uL (ref 150–400)
RBC: 3.47 MIL/uL — ABNORMAL LOW (ref 3.87–5.11)

## 2012-04-29 LAB — BASIC METABOLIC PANEL
CO2: 28 mEq/L (ref 19–32)
Calcium: 8.7 mg/dL (ref 8.4–10.5)
Creatinine, Ser: 0.68 mg/dL (ref 0.50–1.10)
GFR calc non Af Amer: >90 mL/min (ref >90)
Glucose, Bld: 131 mg/dL — ABNORMAL HIGH (ref 70–99)
Sodium: 139 mEq/L (ref 135–145)

## 2012-04-29 MED ORDER — MORPHINE SULFATE 2 MG/ML IJ SOLN: 1.0000 mg | INTRAMUSCULAR | Status: DC | PRN

## 2012-04-29 NOTE — Progress Notes (Signed)
   Subjective: 1 Day Post-Op Procedure(s) (LRB): TOTAL KNEE ARTHROPLASTY (Right) Patient reports pain as moderate last night but doing better this morning. Patient seen in rounds with Dr. Lequita Halt. Patient is having problems with pain in the knee, requiring pain medications We will start therapy today.  Plan is to go Home after hospital stay.  Objective: Vital signs in last 24 hours: Temp:  [95 F (35 C)-98.5 F (36.9 C)] 98.5 F (36.9 C) (07/30 0624) Pulse Rate:  [48-91] 75  (07/30 0624) Resp:  [8-18] 14  (07/30 0624) BP: (94-123)/(49-79) 110/69 mmHg (07/30 0624) SpO2:  [94 %-100 %] 100 % (07/30 0624) FiO2 (%):  [100 %] 100 % (07/29 1430) Weight:  [51.846 kg (114 lb 4.8 oz)] 51.846 kg (114 lb 4.8 oz) (07/29 1430)  Intake/Output from previous day:  Intake/Output Summary (Last 24 hours) at 04/29/12 0813 Last data filed at 04/29/12 1610  Gross per 24 hour  Intake   6430 ml  Output   4415 ml  Net   2015 ml    Intake/Output this shift: UOP 1600  Labs:  Basename 04/29/12 0410  HGB 11.1*    Basename 04/29/12 0410  WBC 8.1  RBC 3.47*  HCT 32.4*  PLT 190    Basename 04/29/12 0410  NA 139  K 4.6  CL 104  CO2 28  BUN 5*  CREATININE 0.68  GLUCOSE 131*  CALCIUM 8.7    Basename 04/28/12 0852  LABPT --  INR 1.03    EXAM General - Patient is Alert, Appropriate and Oriented Extremity - Neurovascular intact Sensation intact distally Dorsiflexion/Plantar flexion intact Dressing - dressing C/D/I Motor Function - intact, moving foot and toes well on exam.  Hemovac pulled without difficulty.  Past Medical History  Diagnosis Date  . ADD (attention deficit disorder)   . MVP (mitral valve prolapse)   . Vitiligo   . Allergy     RHINITIS  . Cancer     FAMILY HX (COLON)  . Herpes labialis   . FH: colonic polyps   . Complication of anesthesia     itching   . Hypertension   . Heart murmur     MVP  . Pneumonia     hx of   . Arthritis      Assessment/Plan: 1 Day Post-Op Procedure(s) (LRB): TOTAL KNEE ARTHROPLASTY (Right) Principal Problem:  *OA (osteoarthritis) of knee   Advance diet Up with therapy Discharge home with home health  DVT Prophylaxis - Xarelto Weight-Bearing as tolerated to right leg No vaccines. D/C PCA Morphine, Change to IV push D/C O2 and Pulse OX and try on Room Air  PERKINS, Marlowe Sax 04/29/2012, 8:13 AM

## 2012-04-29 NOTE — Evaluation (Signed)
Physical Therapy Evaluation Patient Details Name: Annette Murray MRN: [id number] DOB: [birth year] Today's Date: 04/29/2012 Time: 1010-1039 PT Time Calculation (min): 29 min  PT Assessment / Plan / Recommendation Clinical Impression  56 yo female s/p R TKA. Mobililizing well. Anticipate pt will continue to progress well.     PT Assessment  Patient needs continued PT services    Follow Up Recommendations  Home health PT    Barriers to Discharge        Equipment Recommendations  None recommended by PT    Recommendations for Other Services OT consult   Frequency 7X/week    Precautions / Restrictions Precautions Precautions: Knee Required Braces or Orthoses: Knee Immobilizer - Right Knee Immobilizer - Right: Discontinue once straight leg raise with < 10 degree lag Restrictions Weight Bearing Restrictions: No RLE Weight Bearing: Weight bearing as tolerated   Pertinent Vitals/Pain       Mobility  Bed Mobility Bed Mobility: Supine to Sit Supine to Sit: HOB elevated;With rails;4: Min assist Details for Bed Mobility Assistance: Assist for R LE off bed.  Transfers Transfers: Sit to Stand;Stand to Sit Sit to Stand: 4: Min assist;With upper extremity assist;With armrests;From bed Stand to Sit: 4: Min guard;To chair/3-in-1;With upper extremity assist;With armrests Details for Transfer Assistance: VCs safety, technique, hand placement. Assist to rise, stabilize.  Ambulation/Gait Ambulation/Gait Assistance: 4: Min assist Ambulation Distance (Feet): 75 Feet Assistive device: Rolling walker Ambulation/Gait Assistance Details: VCs safety, technique, sequence, step length. Assist to stabilize intermittently Gait Pattern: Step-to pattern;Step-through pattern;Antalgic;Decreased stride length    Exercises     PT Diagnosis: Difficulty walking;Acute pain;Abnormality of gait  PT Problem List: Decreased strength;Decreased range of motion;Decreased activity tolerance;Decreased  mobility;Pain;Decreased knowledge of use of DME;Decreased knowledge of precautions PT Treatment Interventions: DME instruction;Gait training;Stair training;Functional mobility training;Therapeutic activities;Therapeutic exercise;Patient/family education   PT Goals Acute Rehab PT Goals PT Goal Formulation: With patient Potential to Achieve Goals: Good Pt will go Supine/Side to Sit: with supervision PT Goal: Supine/Side to Sit - Progress: Goal set today Pt will go Sit to Supine/Side: with supervision PT Goal: Sit to Supine/Side - Progress: Goal set today Pt will go Sit to Stand: with supervision PT Goal: Sit to Stand - Progress: Goal set today Pt will Ambulate: 51 - 150 feet;with supervision;with least restrictive assistive device PT Goal: Ambulate - Progress: Goal set today Pt will Go Up / Down Stairs: 1-2 stairs;with least restrictive assistive device;with min assist (1 step) PT Goal: Up/Down Stairs - Progress: Goal set today  Visit Information  Last PT Received On: 04/29/12 Assistance Needed: +1    Subjective Data  Subjective: "Oh no" Patient Stated Goal: Home   Prior Functioning  Home Living Lives With: Spouse Available Help at Discharge: Family Type of Home: House Home Access: Stairs to enter Secretary/administrator of Steps: 1 Entrance Stairs-Rails: None Home Layout: One level Home Adaptive Equipment: Walker - rolling;Bedside commode/3-in-1;Shower chair with back;Crutches Prior Function Level of Independence: Independent Able to Take Stairs?: Yes Driving: Yes Communication Communication: No difficulties    Cognition  Overall Cognitive Status: Appears within functional limits for tasks assessed/performed Arousal/Alertness: Awake/alert Orientation Level: Appears intact for tasks assessed Behavior During Session: [facility] for tasks performed    Extremity/Trunk Assessment Right Lower Extremity Assessment RLE ROM/Strength/Tone: Deficits RLE ROM/Strength/Tone Deficits:  moves ankle well. SLR 2/5 RLE Sensation: WFL - Light Touch Left Lower Extremity Assessment LLE ROM/Strength/Tone: WFL for tasks assessed Trunk Assessment Trunk Assessment: Normal   Balance    End of Session  PT - End of Session Equipment Utilized During Treatment: Gait belt;Right knee immobilizer Activity Tolerance: Patient tolerated treatment well Patient left: in chair;with call bell/phone within reach  GP     [name] [facility] 04/29/2012, 12:21 PM [phone]

## 2012-04-29 NOTE — Progress Notes (Signed)
Physical Therapy Treatment Patient Details Name: Annette Murray MRN: [id number] DOB: 12-20-1955 Today's Date: 04/29/2012 Time: 7829-5621 PT Time Calculation (min): 30 min  PT Assessment / Plan / Recommendation Comments on Treatment Session  Progressing well.     Follow Up Recommendations  Home Murray PT    Barriers to Discharge        Equipment Recommendations  None recommended by PT    Recommendations for Other Services OT consult  Frequency 7X/week   Plan Discharge plan remains appropriate    Precautions / Restrictions Precautions Precautions: Knee Required Braces or Orthoses: Knee Immobilizer - Right Knee Immobilizer - Right: Discontinue once straight leg raise with < 10 degree lag Restrictions Weight Bearing Restrictions: No RLE Weight Bearing: Weight bearing as tolerated   Pertinent Vitals/Pain     Mobility  Bed Mobility Bed Mobility: Sit to Supine Sit to Supine: HOB flat;With rail Details for Bed Mobility Assistance: Assist for R LE onto bed.  Transfers Transfers: Sit to Stand;Stand to Sit Sit to Stand: 4: Min guard;From chair/3-in-1;With upper extremity assist;With armrests Stand to Sit: 4: Min guard;To bed;With upper extremity assist Details for Transfer Assistance: VCs safety, hand placement.  Ambulation/Gait Ambulation/Gait Assistance: 4: Min guard Ambulation Distance (Feet): 125 Feet Assistive device: Rolling walker Ambulation/Gait Assistance Details: VCs safety, sequence, technique, step length. Assist to stabilize intermittently. Enocuraged pt to increase WBing through R LE.  Gait Pattern: Step-to pattern;Step-through pattern;Decreased stride length;Antalgic    Exercises Total Joint Exercises Ankle Circles/Pumps: AROM;Both;10 reps;Supine Quad Sets: AROM;Both;10 reps;Supine Short Arc Quad: AAROM;Right;10 reps;Supine;Strengthening Heel Slides: AAROM;Right;Strengthening;10 reps;Supine Hip ABduction/ADduction: AAROM;Strengthening;Right;10  reps;Supine Straight Leg Raises: AROM;Strengthening;Right;10 reps;Supine   PT Diagnosis: Difficulty walking;Acute pain;Abnormality of gait  PT Problem List: Decreased strength;Decreased range of motion;Decreased activity tolerance;Decreased mobility;Pain;Decreased knowledge of use of DME;Decreased knowledge of precautions PT Treatment Interventions: DME instruction;Gait training;Stair training;Functional mobility training;Therapeutic activities;Therapeutic exercise;Patient/family education   PT Goals Acute Rehab PT Goals PT Goal Formulation: With patient Potential to Achieve Goals: Good Pt will go Supine/Side to Sit: with supervision PT Goal: Supine/Side to Sit - Progress: Goal set today Pt will go Sit to Supine/Side: with supervision PT Goal: Sit to Supine/Side - Progress: Progressing toward goal Pt will go Sit to Stand: with supervision PT Goal: Sit to Stand - Progress: Progressing toward goal Pt will Ambulate: 51 - 150 feet;with supervision;with least restrictive assistive device PT Goal: Ambulate - Progress: Progressing toward goal Pt will Go Up / Down Stairs: 1-2 stairs;with least restrictive assistive device;with min assist (1 step) PT Goal: Up/Down Stairs - Progress: Goal set today  Visit Information  Last PT Received On: 04/29/12 Assistance Needed: +1    Subjective Data  Subjective: "This hurts more than the walking" (exercises) Patient Stated Goal: home   Cognition  Overall Cognitive Status: Appears within functional limits for tasks assessed/performed Arousal/Alertness: Awake/Murray Orientation Level: Appears intact for tasks assessed Behavior During Session: Annette Murray for tasks performed    Balance     End of Session PT - End of Session Equipment Utilized During Treatment: Gait belt;Right knee immobilizer Activity Tolerance: Patient tolerated treatment well Patient left: in bed;with call bell/phone within reach   GP     Annette Murray Annette Murray 04/29/2012, 3:48  PM [phone]

## 2012-04-30 ENCOUNTER — Encounter (HOSPITAL_COMMUNITY): Payer: Self-pay | Admitting: Orthopedic Surgery

## 2012-04-30 LAB — CBC
MCH: 32.3 pg (ref 26.0–34.0)
MCV: 94.8 fL (ref 78.0–100.0)
Platelets: 172 10*3/uL (ref 150–400)
RDW: 12.6 % (ref 11.5–15.5)
WBC: 8.3 10*3/uL (ref 4.0–10.5)

## 2012-04-30 LAB — BASIC METABOLIC PANEL
Calcium: 8.4 mg/dL (ref 8.4–10.5)
Creatinine, Ser: 0.68 mg/dL (ref 0.50–1.10)
GFR calc Af Amer: >90 mL/min (ref >90)
Sodium: 136 mEq/L (ref 135–145)

## 2012-04-30 NOTE — Progress Notes (Signed)
On-Q pump was accidentally pulled out by pt., catheter tip intact.Geannie Risen, RN

## 2012-04-30 NOTE — Progress Notes (Signed)
Physical Therapy Treatment Patient Details Name: Annette Murray MRN: [id number] DOB: 05-24-1956 Today's Date: 04/30/2012 Time: 0865-7846 PT Time Calculation (min): 21 min  PT Assessment / Plan / Recommendation Comments on Treatment Session  Continuing to progress well.     Follow Up Recommendations  Home health PT    Barriers to Discharge        Equipment Recommendations  None recommended by PT    Recommendations for Other Services    Frequency 7X/week   Plan Discharge plan remains appropriate    Precautions / Restrictions Precautions Precautions: Knee Required Braces or Orthoses: Knee Immobilizer - Right Knee Immobilizer - Right: Discontinue once straight leg raise with < 10 degree lag Restrictions Weight Bearing Restrictions: No RLE Weight Bearing: Weight bearing as tolerated   Pertinent Vitals/Pain 5/10 R knee    Mobility  Bed Mobility Bed Mobility: Not assessed Supine to Sit: 5: Supervision Transfers Transfers: Sit to Stand;Stand to Sit Sit to Stand: With upper extremity assist;With armrests;From chair/3-in-1;5: Supervision Stand to Sit: With upper extremity assist;With armrests;To chair/3-in-1;5: Supervision Details for Transfer Assistance: VCs safety, hand placement.  Ambulation/Gait Ambulation/Gait Assistance: 4: Min guard Ambulation Distance (Feet): 150 Feet Assistive device: Rolling walker Ambulation/Gait Assistance Details: VCs safety, sequence, step length.  Gait Pattern: Step-through pattern;Decreased stride length;Decreased stance time - right    Exercises     PT Diagnosis:    PT Problem List:   PT Treatment Interventions:     PT Goals Acute Rehab PT Goals Pt will go Sit to Stand: with supervision PT Goal: Sit to Stand - Progress: Progressing toward goal Pt will Ambulate: 51 - 150 feet;with supervision PT Goal: Ambulate - Progress: Progressing toward goal  Visit Information  Last PT Received On: 04/30/12 Assistance Needed: +1      Subjective Data  Subjective: "The pain is much more tolerable now" Patient Stated Goal: Home   Cognition  Overall Cognitive Status: Appears within functional limits for tasks assessed/performed Arousal/Alertness: Awake/Murray Orientation Level: Appears intact for tasks assessed Behavior During Session: [facility] for tasks performed    Balance  Balance Balance Assessed: Yes Dynamic Standing Balance Dynamic Standing - Level of Assistance: 5: Stand by assistance  End of Session PT - End of Session Equipment Utilized During Treatment: Gait belt;Right knee immobilizer Activity Tolerance: Patient tolerated treatment well Patient left: in chair;with call bell/phone within reach CPM Right Knee CPM Right Knee: Off   GP     Annette Murray [facility] 04/30/2012, 10:45 AM [phone]

## 2012-04-30 NOTE — Progress Notes (Signed)
Physical Therapy Treatment Patient Details Name: Annette Murray MRN: [id number] DOB: [birth year] Today's Date: 04/30/2012 Time: 6962-9528 PT Time Calculation (min): 23 min  PT Assessment / Plan / Recommendation Comments on Treatment Session  Progressing well.     Follow Up Recommendations  Home health PT    Barriers to Discharge        Equipment Recommendations  None recommended by PT    Recommendations for Other Services    Frequency 7X/week   Plan Discharge plan remains appropriate    Precautions / Restrictions Precautions Precautions: Knee Required Braces or Orthoses: Knee Immobilizer - Right Knee Immobilizer - Right: Discontinue once straight leg raise with < 10 degree lag (DC'd KI 04/30/12-pt able to SLR) Restrictions Weight Bearing Restrictions: No RLE Weight Bearing: Weight bearing as tolerated   Pertinent Vitals/Pain     Mobility  Bed Mobility Bed Mobility: Supine to Sit;Sit to Supine Supine to Sit: 5: Supervision Sit to Supine: 5: Supervision Transfers Transfers: Sit to Stand;Stand to Sit Sit to Stand: 5: Supervision;From bed Stand to Sit: 5: Supervision;To bed Details for Transfer Assistance: VCs safety.  Ambulation/Gait Ambulation/Gait Assistance: 4: Min guard Ambulation Distance (Feet): 150 Feet Assistive device: Rolling walker Ambulation/Gait Assistance Details: VCs safety, step length. Pt is beginning to utilize reciprocal gait pattern.  Gait Pattern: Step-to pattern;Step-through pattern;Antalgic;Decreased stride length;Decreased step length - right    Exercises Total Joint Exercises Ankle Circles/Pumps: AROM;Both;10 reps;Supine Quad Sets: AROM;Both;10 reps;Supine Short Arc Quad: AROM;Right;10 reps;Supine;Strengthening Heel Slides: AAROM;Right;10 reps;Supine Hip ABduction/ADduction: AROM;Right;10 reps;Supine;Strengthening Straight Leg Raises: AROM;Strengthening;Right;10 reps;Supine   PT Diagnosis:    PT Problem List:   PT Treatment  Interventions:     PT Goals Acute Rehab PT Goals Pt will go Supine/Side to Sit: with supervision PT Goal: Supine/Side to Sit - Progress: Progressing toward goal Pt will go Sit to Supine/Side: with supervision PT Goal: Sit to Supine/Side - Progress: Progressing toward goal Pt will go Sit to Stand: with supervision PT Goal: Sit to Stand - Progress: Progressing toward goal Pt will Ambulate: 51 - 150 feet;with supervision PT Goal: Ambulate - Progress: Progressing toward goal  Visit Information  Last PT Received On: 04/30/12 Assistance Needed: +1    Subjective Data  Subjective: "Its just so sore" Patient Stated Goal: Home   Cognition  Overall Cognitive Status: Appears within functional limits for tasks assessed/performed Arousal/Alertness: Awake/Murray Orientation Level: Appears intact for tasks assessed Behavior During Session: [facility] for tasks performed    Balance     End of Session PT - End of Session Activity Tolerance: Patient tolerated treatment well Patient left: in bed;with call bell/phone within reach   GP     Annette Murray [facility] 04/30/2012, 4:34 PM [phone]

## 2012-04-30 NOTE — Evaluation (Signed)
Occupational Therapy One Time Evaluation Patient Details Name: Annette Murray MRN: [id number] DOB: Feb 25, 1956 Today's Date: 04/30/2012 Time: 4696-2952 OT Time Calculation (min): 25 min  OT Assessment / Plan / Recommendation Clinical Impression  Pt is s/p R TKA and is doing well with ADL. Husband can assist at discharge and pt already has DME for OT. All education completed.    OT Assessment  Patient does not need any further OT services    Follow Up Recommendations  No OT follow up;Supervision/Assistance - 24 hour    Barriers to Discharge      Equipment Recommendations  None recommended by OT;None recommended by PT    Recommendations for Other Services    Frequency       Precautions / Restrictions Precautions Precautions: Knee Required Braces or Orthoses: Knee Immobilizer - Right Knee Immobilizer - Right: Discontinue once straight leg raise with < 10 degree lag Restrictions RLE Weight Bearing: Weight bearing as tolerated        ADL  Eating/Feeding: Simulated;Independent Where Assessed - Eating/Feeding: Chair Grooming: Simulated;Set up Where Assessed - Grooming: Unsupported sitting Upper Body Bathing: Simulated;Chest;Right arm;Left arm;Abdomen;Set up Where Assessed - Upper Body Bathing: Unsupported sitting Lower Body Bathing: Simulated;Min guard Where Assessed - Lower Body Bathing: Supported sit to stand Upper Body Dressing: Simulated;Set up Where Assessed - Upper Body Dressing: Unsupported sitting Lower Body Dressing: Simulated;Min guard Where Assessed - Lower Body Dressing: Supported sit to stand Toilet Transfer: Simulated;Min Pension scheme manager Method: Other (comment) (ambulating) Toileting - Clothing Manipulation and Hygiene: Simulated;Min guard Where Assessed - Toileting Clothing Manipulation and Hygiene: Sit to stand from 3-in-1 or toilet Tub/Shower Transfer: Performed;Min guard Tub/Shower Transfer Method: Other (comment) (step back over ledge and then  over ledge to come out) Equipment Used: Rolling walker ADL Comments: Pt doing well. Husband can assist at discharge.  Can already touch R toes.    OT Diagnosis:    OT Problem List:   OT Treatment Interventions:     OT Goals    Visit Information  Last OT Received On: 04/30/12 Assistance Needed: +1    Subjective Data  Subjective: the pain last night was bad. just more sore today Patient Stated Goal: home when able   Prior Functioning  Vision/Perception  Home Living Lives With: Spouse Available Help at Discharge: Family Type of Home: House Home Access: Stairs to enter Secretary/administrator of Steps: 1 Entrance Stairs-Rails: None Home Layout: One level Bathroom Shower/Tub: Health visitor: Standard Home Adaptive Equipment: Walker - rolling;Bedside commode/3-in-1;Shower chair with back;Crutches Prior Function Level of Independence: Independent Able to Take Stairs?: Yes Driving: Yes Communication Communication: No difficulties      Cognition  Overall Cognitive Status: Appears within functional limits for tasks assessed/performed Arousal/Alertness: Awake/alert Orientation Level: Appears intact for tasks assessed Behavior During Session: [facility] for tasks performed    Extremity/Trunk Assessment Right Upper Extremity Assessment RUE ROM/Strength/Tone: [facility] for tasks assessed Left Upper Extremity Assessment LUE ROM/Strength/Tone: WFL for tasks assessed   Mobility Bed Mobility Bed Mobility: Supine to Sit Supine to Sit: 5: Supervision Transfers Transfers: Sit to Stand;Stand to Sit Sit to Stand: 5: Supervision;With upper extremity assist;From bed Stand to Sit: 5: Supervision;With upper extremity assist;To chair/3-in-1 Details for Transfer Assistance: min verbal cues for hand placement to push up from EOB and to reach back for armrests of chair.   Exercise    Balance Balance Balance Assessed: Yes Dynamic Standing Balance Dynamic Standing - Level of  Assistance: 5: Stand by assistance  End  of Session OT - End of Session Activity Tolerance: Patient tolerated treatment well Patient left: in chair;with call bell/phone within reach  GO     [name] [phone] 04/30/2012, 9:33 AM

## 2012-04-30 NOTE — Plan of Care (Signed)
Problem: Consults Goal: Diagnosis- Total Joint Replacement Outcome: Completed/Met Date Met:  04/30/12 Right total knee

## 2012-05-01 DIAGNOSIS — D62 Acute posthemorrhagic anemia: Secondary | ICD-10-CM | POA: Diagnosis not present

## 2012-05-01 LAB — CBC
Platelets: 168 10*3/uL (ref 150–400)
RBC: 3.02 MIL/uL — ABNORMAL LOW (ref 3.87–5.11)
RDW: 12.5 % (ref 11.5–15.5)
WBC: 5.3 10*3/uL (ref 4.0–10.5)

## 2012-05-01 MED ORDER — RIVAROXABAN 10 MG PO TABS: 10.0000 mg | ORAL_TABLET | Freq: Every day | ORAL | Status: DC

## 2012-05-01 MED ORDER — OXYCODONE HCL 5 MG PO TABS: 5.0000 mg | ORAL_TABLET | ORAL | Status: AC | PRN

## 2012-05-01 MED ORDER — METHOCARBAMOL 500 MG PO TABS: 500.0000 mg | ORAL_TABLET | Freq: Four times a day (QID) | ORAL | Status: AC | PRN

## 2012-05-01 NOTE — Progress Notes (Signed)
Physical Therapy Treatment Patient Details Name: Annette Murray MRN: [id number] DOB: Sep 08, 1956 Today's Date: 05/01/2012 Time: 6295-2841 PT Time Calculation (min): 28 min  PT Assessment / Plan / Recommendation Comments on Treatment Session  Practiced step. Completed all education. No further questions from pt. Plans to d/c today.     Follow Up Recommendations  Home health PT    Barriers to Discharge        Equipment Recommendations  None recommended by PT    Recommendations for Other Services    Frequency 7X/week   Plan Discharge plan remains appropriate    Precautions / Restrictions Precautions Precautions: Knee Required Braces or Orthoses: Knee Immobilizer - Right Knee Immobilizer - Right: Discontinue once straight leg raise with < 10 degree lag Restrictions Weight Bearing Restrictions: No RLE Weight Bearing: Weight bearing as tolerated   Pertinent Vitals/Pain     Mobility  Bed Mobility Bed Mobility: Supine to Sit;Sit to Supine Supine to Sit: 5: Supervision;HOB elevated Sit to Supine: 5: Supervision;HOB elevated Details for Bed Mobility Assistance: Pt used UEs to assist R LE onto/off bed Transfers Transfers: Sit to Stand;Stand to Sit Sit to Stand: 5: Supervision;With upper extremity assist;From bed Stand to Sit: 5: Supervision;With upper extremity assist;To bed Details for Transfer Assistance: VCs safety.  Ambulation/Gait Ambulation/Gait Assistance: 5: Supervision Ambulation Distance (Feet): 150 Feet Assistive device: Rolling walker Ambulation/Gait Assistance Details: VCs safety.  Gait Pattern: Step-through pattern;Antalgic;Decreased step length - right Stairs: Yes Stairs Assistance: 4: Min guard Stairs Assistance Details (indicate cue type and reason): VCs safety, technique.  Stair Management Technique: Forwards;With walker Number of Stairs: 1     Exercises Total Joint Exercises Ankle Circles/Pumps: AROM;10 reps;Supine Quad Sets: AROM;Right;10  reps;Supine Heel Slides: AAROM;Right;10 reps;Supine;Strengthening Hip ABduction/ADduction: AAROM;Right;Strengthening;10 reps;Supine Straight Leg Raises: AAROM;Strengthening;Right;10 reps;Supine   PT Diagnosis:    PT Problem List:   PT Treatment Interventions:     PT Goals Acute Rehab PT Goals Pt will go Supine/Side to Sit: with supervision PT Goal: Supine/Side to Sit - Progress: Met Pt will go Sit to Supine/Side: with supervision PT Goal: Sit to Supine/Side - Progress: Met Pt will go Sit to Stand: with supervision PT Goal: Sit to Stand - Progress: Met Pt will Ambulate: 51 - 150 feet;with supervision PT Goal: Ambulate - Progress: Met Pt will Go Up / Down Stairs: 1-2 stairs;with min assist;with least restrictive assistive device PT Goal: Up/Down Stairs - Progress: Met  Visit Information  Last PT Received On: 05/01/12 Assistance Needed: +1    Subjective Data  Subjective: "It's so sore and hot" Patient Stated Goal: Home   Cognition  Overall Cognitive Status: Appears within functional limits for tasks assessed/performed Arousal/Alertness: Awake/alert Orientation Level: Appears intact for tasks assessed Behavior During Session: [facility] for tasks performed    Balance     End of Session PT - End of Session Equipment Utilized During Treatment: Gait belt;Right knee immobilizer Activity Tolerance: Patient tolerated treatment well Patient left: in bed;with call bell/phone within reach;with family/visitor present   GP     [name] [facility] 05/01/2012, 11:08 AM [phone]

## 2012-05-01 NOTE — Care Management Note (Signed)
    Page 1 of 2   05/01/2012     1:57:09 PM   CARE MANAGEMENT NOTE 05/01/2012  Patient:  Annette Murray, Annette Murray   Account Number:  [id number]  Date Initiated:  05/01/2012  Documentation initiated by:  Colleen Can  Subjective/Objective Assessment:   dx osteoarthritis knee-right; total knee replacemnt on day of admission     Action/Plan:   Cm spoke with patient. Plans are for patient to return to her home in [place] where spouse will be caregiver. She already has RW and commode seat. She is requesting Genevieve Norlander for [facility] services. List of HH agencies placed in shadow chart   Anticipated DC Date:  05/01/2012   Anticipated DC Plan:  HOME W HOME HEALTH SERVICES  In-house referral  NA      DC Planning Services  CM consult      [facility] Choice  HOME HEALTH   Choice offered to / List presented to:  C-1 Patient   DME arranged  NA      DME agency  NA     HH arranged  HH-2 PT      [facility] agency  [facility]   Status of service:  Completed, signed off Medicare Important Message given?  NO (If response is "NO", the following Medicare IM given date fields will be blank) Date Medicare IM given:   Date Additional Medicare IM given:    Discharge Disposition:  HOME W HOME HEALTH SERVICES  Per UR Regulation:  Reviewed for med. necessity/level of care/duration of stay  If discussed at Long Length of Stay Meetings, dates discussed:    Comments:  05/01/2012 Raynelle Bring BSN CCM [phone] [facility] services will start day after discharge from hospital. anticipate discharge today.

## 2012-05-01 NOTE — Progress Notes (Signed)
  LATE ENTRY NOTE Date of Service of Visit - 04/30/2012  Subjective: 2 Days Post-Op Procedure(s) (LRB): TOTAL KNEE ARTHROPLASTY (Right) Patient reports pain as mild and moderate.   Patient seen in rounds for Dr. Lequita Halt.  Did a lot of therapy the day before and probably why hurting more on day 2. Patient is well, and has had no acute complaints or problems Plan is to go Home after hospital stay.  Objective: Vital signs in last 24 hours: Temp:  [98.8 F (37.1 C)-100.4 F (38 C)] 100.4 F (38 C) (08/01 0615) Pulse Rate:  [59-73] 73  (08/01 0615) Resp:  [15-16] 16  (08/01 0615) BP: (100-112)/(66-72) 112/72 mmHg (08/01 0615) SpO2:  [97 %-98 %] 97 % (08/01 0615)  Intake/Output from previous day:  Intake/Output Summary (Last 24 hours) at 05/01/12 1114 Last data filed at 05/01/12 0946  Gross per 24 hour  Intake    972 ml  Output   3550 ml  Net  -2578 ml    Intake/Output this shift: Total I/O In: 240 [P.O.:240] Out: 400 [Urine:400]  Labs:  Basename 05/01/12 0400 04/30/12 0345 04/29/12 0410  HGB 9.8* 10.0* 11.1*    Basename 05/01/12 0400 04/30/12 0345  WBC 5.3 8.3  RBC 3.02* 3.10*  HCT 28.6* 29.4*  PLT 168 172    Basename 04/30/12 0345 04/29/12 0410  NA 136 139  K 3.8 4.6  CL 100 104  CO2 29 28  BUN 7 5*  CREATININE 0.68 0.68  GLUCOSE 106* 131*  CALCIUM 8.4 8.7   No results found for this basename: LABPT:2,INR:2 in the last 72 hours  EXAM General - Patient is Alert, Appropriate and Oriented Extremity - Neurovascular intact Sensation intact distally Dorsiflexion/Plantar flexion intact Dressing/Incision - clean, dry, no drainage, healing Motor Function - intact, moving foot and toes well on exam.   Past Medical History  Diagnosis Date  . ADD (attention deficit disorder)   . MVP (mitral valve prolapse)   . Vitiligo   . Allergy     RHINITIS  . Cancer     FAMILY HX (COLON)  . Herpes labialis   . FH: colonic polyps   . Complication of anesthesia    itching   . Hypertension   . Heart murmur     MVP  . Pneumonia     hx of   . Arthritis     Assessment/Plan: 2 Days Post-Op Procedure(s) (LRB): TOTAL KNEE ARTHROPLASTY (Right) Principal Problem:  *OA (osteoarthritis) of knee Active Problems:  Postoperative anemia due to acute blood loss   Up with therapy Plan for discharge tomorrow Discharge home with home health  DVT Prophylaxis - Xarelto Weight-Bearing as tolerated to right leg  Brannen Koppen 05/01/2012, 11:14 AM

## 2012-05-01 NOTE — Progress Notes (Signed)
   Subjective: 3 Days Post-Op Procedure(s) (LRB): TOTAL KNEE ARTHROPLASTY (Right) Patient reports pain as mild.   Patient seen in rounds with Dr. Lequita Halt. Patient is well, and has had no acute complaints or problems Patient is ready to go home today.  Objective: Vital signs in last 24 hours: Temp:  [98.8 F (37.1 C)-100.4 F (38 C)] 100.4 F (38 C) (08/01 0615) Pulse Rate:  [59-73] 73  (08/01 0615) Resp:  [15-16] 16  (08/01 0615) BP: (100-112)/(66-72) 112/72 mmHg (08/01 0615) SpO2:  [97 %-98 %] 97 % (08/01 0615)  Intake/Output from previous day:  Intake/Output Summary (Last 24 hours) at 05/01/12 1115 Last data filed at 05/01/12 0946  Gross per 24 hour  Intake    972 ml  Output   3550 ml  Net  -2578 ml    Intake/Output this shift: Total I/O In: 240 [P.O.:240] Out: 400 [Urine:400]  Labs:  Basename 05/01/12 0400 04/30/12 0345 04/29/12 0410  HGB 9.8* 10.0* 11.1*    Basename 05/01/12 0400 04/30/12 0345  WBC 5.3 8.3  RBC 3.02* 3.10*  HCT 28.6* 29.4*  PLT 168 172    Basename 04/30/12 0345 04/29/12 0410  NA 136 139  K 3.8 4.6  CL 100 104  CO2 29 28  BUN 7 5*  CREATININE 0.68 0.68  GLUCOSE 106* 131*  CALCIUM 8.4 8.7   No results found for this basename: LABPT:2,INR:2 in the last 72 hours  EXAM: General - Patient is Alert, Appropriate and Oriented Extremity - Neurovascular intact Sensation intact distally Dorsiflexion/Plantar flexion intact Incision - clean, dry, no drainage, healing Motor Function - intact, moving foot and toes well on exam.   Assessment/Plan: 3 Days Post-Op Procedure(s) (LRB): TOTAL KNEE ARTHROPLASTY (Right) Procedure(s) (LRB): TOTAL KNEE ARTHROPLASTY (Right) Past Medical History  Diagnosis Date  . ADD (attention deficit disorder)   . MVP (mitral valve prolapse)   . Vitiligo   . Allergy     RHINITIS  . Cancer     FAMILY HX (COLON)  . Herpes labialis   . FH: colonic polyps   . Complication of anesthesia     itching   .  Hypertension   . Heart murmur     MVP  . Pneumonia     hx of   . Arthritis    Principal Problem:  *OA (osteoarthritis) of knee Active Problems:  Postoperative anemia due to acute blood loss   Discharge home with home health Diet - Cardiac diet Follow up - in 2 weeks Activity - WBAT Disposition - Home Condition Upon Discharge - Good D/C Meds - See DC Summary DVT Prophylaxis - Xarelto  PERKINS, ALEXZANDREW 05/01/2012, 11:15 AM

## 2012-05-11 NOTE — Discharge Summary (Signed)
Physician Discharge Summary   Patient ID: [name] MRN: [id number] DOB/AGE: [birth year] 57 y.o.  Admit date: 04/28/2012 Discharge date: 05/01/2012  Primary Diagnosis: Osteoarthritis Right knee   Admission Diagnoses:  Past Medical History  Diagnosis Date  . ADD (attention deficit disorder)   . MVP (mitral valve prolapse)   . Vitiligo   . Allergy     RHINITIS  . Cancer     FAMILY HX (COLON)  . Herpes labialis   . FH: colonic polyps   . Complication of anesthesia     itching   . Hypertension   . Heart murmur     MVP  . Pneumonia     hx of   . Arthritis    Discharge Diagnoses:   Principal Problem:  *OA (osteoarthritis) of knee Active Problems:  Postoperative anemia due to acute blood loss  Procedure:  Procedure(s) (LRB): TOTAL KNEE ARTHROPLASTY (Right)   Consults: None  HPI: Annette Murray is a 56 y.o. year old female with end stage OA of her right knee with progressively worsening pain and dysfunction. She has constant pain, with activity and at rest and significant functional deficits with difficulties even with ADLs. She has had extensive non-op management including analgesics, injections of cortisone and viscosupplements, and home exercise program, but remains in significant pain with significant dysfunction.Radiographs show bone on bone arthritis medial and patellofemoral. She presents now for rightt Total Knee Arthroplasty.       Laboratory Data: Hospital Outpatient Visit on 04/21/2012  Component Date Value Range Status  . aPTT 04/21/2012 29  24 - 37 seconds Final  . WBC 04/21/2012 3.8* 4.0 - 10.5 K/uL Final  . RBC 04/21/2012 4.74  3.87 - 5.11 MIL/uL Final  . Hemoglobin 04/21/2012 15.2* 12.0 - 15.0 g/dL Final  . HCT 86/57/8469 44.9  36.0 - 46.0 % Final  . MCV 04/21/2012 94.7  78.0 - 100.0 fL Final  . MCH 04/21/2012 32.1  26.0 - 34.0 pg Final  . MCHC 04/21/2012 33.9  30.0 - 36.0 g/dL Final  . RDW 62/95/2841 12.2  11.5 - 15.5 % Final  . Platelets  04/21/2012 235  150 - 400 K/uL Final  . Sodium 04/21/2012 139  135 - 145 mEq/L Final  . Potassium 04/21/2012 4.5  3.5 - 5.1 mEq/L Final  . Chloride 04/21/2012 100  96 - 112 mEq/L Final  . CO2 04/21/2012 31  19 - 32 mEq/L Final  . Glucose, Bld 04/21/2012 102* 70 - 99 mg/dL Final  . BUN 32/44/0102 12  6 - 23 mg/dL Final  . Creatinine, Ser 04/21/2012 0.76  0.50 - 1.10 mg/dL Final  . Calcium 72/53/6644 9.7  8.4 - 10.5 mg/dL Final  . Total Protein 04/21/2012 7.0  6.0 - 8.3 g/dL Final  . Albumin 03/47/4259 4.4  3.5 - 5.2 g/dL Final  . AST 56/38/7564 21  0 - 37 U/L Final  . ALT 04/21/2012 14  0 - 35 U/L Final  . Alkaline Phosphatase 04/21/2012 74  39 - 117 U/L Final  . Total Bilirubin 04/21/2012 0.6  0.3 - 1.2 mg/dL Final  . GFR calc non Af Amer 04/21/2012 >90  >90 mL/min Final  . GFR calc Af Amer 04/21/2012 >90  >90 mL/min Final   Comment:                                 The eGFR has been calculated  using the CKD EPI equation.                          This calculation has not been                          validated in all clinical                          situations.                          eGFR's persistently                          <90 mL/min signify                          possible Chronic Kidney Disease.  . Color, Urine 04/21/2012 YELLOW  YELLOW Final  . APPearance 04/21/2012 CLEAR  CLEAR Final  . Specific Gravity, Urine 04/21/2012 1.021  1.005 - 1.030 Final  . pH 04/21/2012 7.0  5.0 - 8.0 Final  . Glucose, UA 04/21/2012 NEGATIVE  NEGATIVE mg/dL Final  . Hgb urine dipstick 04/21/2012 NEGATIVE  NEGATIVE Final  . Bilirubin Urine 04/21/2012 NEGATIVE  NEGATIVE Final  . Ketones, ur 04/21/2012 NEGATIVE  NEGATIVE mg/dL Final  . Protein, ur 16/07/9603 NEGATIVE  NEGATIVE mg/dL Final  . Urobilinogen, UA 04/21/2012 1.0  0.0 - 1.0 mg/dL Final  . Nitrite 54/06/8118 NEGATIVE  NEGATIVE Final  . Leukocytes, UA 04/21/2012 NEGATIVE  NEGATIVE Final   MICROSCOPIC NOT DONE  ON URINES WITH NEGATIVE PROTEIN, BLOOD, LEUKOCYTES, NITRITE, OR GLUCOSE <1000 mg/dL.  Marland Kitchen MRSA, PCR 04/21/2012 NEGATIVE  NEGATIVE Final  . Staphylococcus aureus 04/21/2012 NEGATIVE  NEGATIVE Final   Comment:                                 The Xpert SA Assay (FDA                          approved for NASAL specimens                          only), is one component of                          a comprehensive surveillance                          program.  It is not intended                          to diagnose infection nor to                          guide or monitor treatment.   No results found for this basename: HGB:5 in the last 72 hours No results found for this basename: WBC:2,RBC:2,HCT:2,PLT:2 in the last 72 hours No results found for this basename: NA:2,K:2,CL:2,CO2:2,BUN:2,CREATININE:2,GLUCOSE:2,CALCIUM:2 in the last 72 hours No results found for this basename: LABPT:2,INR:2 in the last 72 hours  X-Rays:Dg Chest 2 View  04/21/2012  *RADIOLOGY REPORT*  Clinical Data: Preoperative respiratory films.  CHEST - 2 VIEW  Comparison: PA and lateral chest 10/14/2010.  Findings: Lungs are clear.  Heart size is normal. No pneumothorax or pleural fluid.  Convex right thoracic scoliosis is identified.  IMPRESSION: No acute disease.  Original Report Authenticated By: Bernadene Bell. D'ALESSIO, M.D.    EKG: Orders placed in visit on 09/20/11  . EKG 12-LEAD     Hospital Course: Patient was admitted to [facility] and taken to the OR and underwent the above state procedure without complications.  Patient tolerated the procedure well and was later transferred to the recovery room and then to the orthopaedic floor for postoperative care.  They were given PO and IV analgesics for pain control following their surgery.  They were given 24 hours of postoperative antibiotics and started on DVT prophylaxis in the form of Xarelto.   PT and OT were ordered for total joint protocol.  Discharge planning  consulted to help with postop disposition and equipment needs.  Patient had a rough night on the evening of surgery due to pain but started to get up OOB with therapy on day one.  PCA Morphine was discontinued and they were weaned over to PO meds.  Hemovac drain was pulled without difficulty.  Continued to work with therapy into day two.  Dressing was changed on day two and the incision was healing well.  By day three, the patient had progressed with therapy and meeting their goals.  Incision was healing well.  Patient was seen in rounds and was ready to go home.  Discharge Medications: Prior to Admission medications   Medication Sig Start Date End Date Taking? Authorizing Provider  atenolol (TENORMIN) 25 MG tablet Take 25 mg by mouth every morning. 10/17/11  Yes Wendall Stade, MD  hydrochlorothiazide (MICROZIDE) 12.5 MG capsule Take 12.5 mg by mouth every morning. 10/01/11  Yes Ronnald Nian, MD  hydroquinone 4 % cream Apply topically 2 (two) times daily. 03/05/12 03/05/13 Yes Ronnald Nian, MD  amphetamine-dextroamphetamine (ADDERALL XR) 15 MG 24 hr capsule Take 15 mg by mouth every morning.     Historical Provider, MD  atenolol-chlorthalidone (TENORETIC) 50-25 MG per tablet Take 1 tablet by mouth every morning. 03/05/12 03/05/13  Ronnald Nian, MD  methocarbamol (ROBAXIN) 500 MG tablet Take 1 tablet (500 mg total) by mouth every 6 (six) hours as needed. 05/01/12 05/11/12  Alexzandrew Perkins, PA  oxyCODONE (OXY IR/ROXICODONE) 5 MG immediate release tablet Take 1-2 tablets (5-10 mg total) by mouth every 3 (three) hours as needed. 05/01/12 05/11/12  Alexzandrew Julien Girt, PA  rivaroxaban (XARELTO) 10 MG TABS tablet Take 1 tablet (10 mg total) by mouth daily with breakfast. Take Xarelto for two and a half more weeks, then discontinue Xarelto. 05/01/12   Alexzandrew Julien Girt, PA  traZODone (DESYREL) 50 MG tablet Take 50 mg by mouth at bedtime.    Historical Provider, MD  valACYclovir (VALTREX) 500 MG tablet Take 500 mg  by mouth every morning. Pt would like Valtrex started as soon as possible after surgery 10/01/11   Ronnald Nian, MD    Diet: Cardiac diet Activity:WBAT Follow-up:in 2 weeks Disposition - Home Discharged Condition: good   Discharge Orders    Future Appointments: Provider: Department: Dept Phone: Center:   07/10/2012 9:45 AM Ronnald Nian, MD [facility] [phone] [facility]     Medication List  As of 05/11/2012  9:35 AM   STOP taking these  medications         BIOTIN PO      FISH OIL PO      multivitamin capsule      TURMERIC PO         TAKE these medications         amphetamine-dextroamphetamine 15 MG 24 hr capsule   Commonly known as: ADDERALL XR   Take 15 mg by mouth every morning.      atenolol 25 MG tablet   Commonly known as: TENORMIN   Take 25 mg by mouth every morning.      atenolol-chlorthalidone 50-25 MG per tablet   Commonly known as: TENORETIC   Take 1 tablet by mouth every morning.      hydrochlorothiazide 12.5 MG capsule   Commonly known as: MICROZIDE   Take 12.5 mg by mouth every morning.      hydroquinone 4 % cream   Apply topically 2 (two) times daily.      methocarbamol 500 MG tablet   Commonly known as: ROBAXIN   Take 1 tablet (500 mg total) by mouth every 6 (six) hours as needed.      oxyCODONE 5 MG immediate release tablet   Commonly known as: Oxy IR/ROXICODONE   Take 1-2 tablets (5-10 mg total) by mouth every 3 (three) hours as needed.      rivaroxaban 10 MG Tabs tablet   Commonly known as: XARELTO   Take 1 tablet (10 mg total) by mouth daily with breakfast. Take Xarelto for two and a half more weeks, then discontinue Xarelto.      traZODone 50 MG tablet   Commonly known as: DESYREL   Take 50 mg by mouth at bedtime.      valACYclovir 500 MG tablet   Commonly known as: VALTREX   Take 500 mg by mouth every morning. Pt would like Valtrex started as soon as possible after surgery           Follow-up Information     Follow up with Loanne Drilling, MD. Schedule an appointment as soon as possible for a visit in 2 weeks.   Contact information:   [facility] [address] [place] [address] [phone]          Signed: Patrica Duel 05/11/2012, 9:35 AM

## 2012-07-10 ENCOUNTER — Encounter: Payer: Self-pay | Admitting: Family Medicine

## 2012-07-10 ENCOUNTER — Ambulatory Visit: Payer: BC Managed Care – PPO | Admitting: Family Medicine

## 2012-07-10 ENCOUNTER — Ambulatory Visit (INDEPENDENT_AMBULATORY_CARE_PROVIDER_SITE_OTHER): Payer: BC Managed Care – PPO | Admitting: Family Medicine

## 2012-07-10 VITALS — BP 140/82 | HR 87 | Wt 111.0 lb

## 2012-07-10 DIAGNOSIS — I10 Essential (primary) hypertension: Secondary | ICD-10-CM

## 2012-07-10 DIAGNOSIS — Z23 Encounter for immunization: Secondary | ICD-10-CM

## 2012-07-10 DIAGNOSIS — D649 Anemia, unspecified: Secondary | ICD-10-CM

## 2012-07-10 DIAGNOSIS — Z96659 Presence of unspecified artificial knee joint: Secondary | ICD-10-CM

## 2012-07-10 LAB — CBC WITH DIFFERENTIAL/PLATELET
Basophils Absolute: 0 10*3/uL (ref 0.0–0.1)
Eosinophils Absolute: 0.1 10*3/uL (ref 0.0–0.7)
Eosinophils Relative: 1 % (ref 0–5)
HCT: 37.5 % (ref 36.0–46.0)
Lymphocytes Relative: 42 % (ref 12–46)
MCH: 30.4 pg (ref 26.0–34.0)
MCHC: 34.9 g/dL (ref 30.0–36.0)
MCV: 87 fL (ref 78.0–100.0)
Monocytes Absolute: 0.5 10*3/uL (ref 0.1–1.0)
RDW: 13.1 % (ref 11.5–15.5)

## 2012-07-10 NOTE — Progress Notes (Signed)
  Subjective:    Patient ID: Annette Murray, female    DOB: 12-25-1955, 56 y.o.   MRN: [id number]  HPI She is here for recheck. She is now taking atenolol and HCTZ separately. The cost differential makes the combination of too expensive. She is comfortable with this dosing. Her blood pressures other than here have been normal. She had right knee replacement July 29. Review of record did show her hemoglobin dropping below 10. She has been on a multivitamin and recently started on one with iron.   Review of Systems     Objective:   Physical Exam Alert and in no distress. Blood pressure is recorded      Assessment & Plan:   1. Need for prophylactic vaccination and inoculation against influenza  Flu vaccine greater than or equal to 3yo preservative free IM  2. Anemia  CBC with Differential  3. Hypertension    4. S/P TKR (total knee replacement) using cement     flu vaccine given. Risks and benefits discussed.

## 2012-07-10 NOTE — Patient Instructions (Signed)
Bring a blood pressure cuff in and compare itwith ours

## 2012-07-11 NOTE — Progress Notes (Signed)
Quick Note:  Called pt cell# to inform her hemglobin has improved is now 13 and to continue on a good vitamin ______

## 2012-07-11 NOTE — Progress Notes (Signed)
Quick Note:  The hemoglobin is now 13. Have her continue on a good multivitamin ______

## 2013-01-02 ENCOUNTER — Telehealth: Payer: Self-pay | Admitting: Internal Medicine

## 2013-01-02 ENCOUNTER — Other Ambulatory Visit: Payer: Self-pay

## 2013-01-02 MED ORDER — HYDROCHLOROTHIAZIDE 12.5 MG PO CAPS: 12.5000 mg | ORAL_CAPSULE | Freq: Every morning | ORAL | Status: DC

## 2013-01-02 NOTE — Telephone Encounter (Signed)
SENT IN PT B/P MEDS

## 2013-01-02 NOTE — Telephone Encounter (Signed)
done

## 2013-01-05 ENCOUNTER — Other Ambulatory Visit: Payer: Self-pay | Admitting: *Deleted

## 2013-01-05 MED ORDER — ATENOLOL 25 MG PO TABS: 25.0000 mg | ORAL_TABLET | Freq: Every morning | ORAL | Status: DC

## 2013-01-14 ENCOUNTER — Telehealth: Payer: Self-pay | Admitting: Family Medicine

## 2013-01-14 MED ORDER — VALACYCLOVIR HCL 500 MG PO TABS: 500.0000 mg | ORAL_TABLET | Freq: Every morning | ORAL | Status: DC

## 2013-01-14 NOTE — Telephone Encounter (Signed)
SENT IN VALACYCLOVIR

## 2013-01-14 NOTE — Telephone Encounter (Signed)
[facility] ref request for Annette Murray   Valacyclovir 500 mg

## 2013-01-29 ENCOUNTER — Other Ambulatory Visit: Payer: Self-pay

## 2013-01-29 MED ORDER — HYDROCHLOROTHIAZIDE 12.5 MG PO CAPS: 12.5000 mg | ORAL_CAPSULE | Freq: Every morning | ORAL | Status: DC

## 2013-01-29 NOTE — Telephone Encounter (Signed)
SENT IN B/P MED FROM FAX PT MUST HAVE OFFICE VISIT BEFORE ANY MORE REFILLS

## 2013-03-05 ENCOUNTER — Telehealth: Payer: Self-pay | Admitting: Family Medicine

## 2013-03-05 NOTE — Telephone Encounter (Signed)
LM

## 2013-03-07 ENCOUNTER — Other Ambulatory Visit: Payer: Self-pay | Admitting: Family Medicine

## 2013-03-09 NOTE — Telephone Encounter (Signed)
Is this okay to fill? 

## 2013-03-15 LAB — HM MAMMOGRAPHY: HM Mammogram: NORMAL

## 2013-03-15 LAB — HM PAP SMEAR: HM Pap smear: NORMAL

## 2013-05-14 ENCOUNTER — Telehealth: Payer: Self-pay | Admitting: Internal Medicine

## 2013-05-14 MED ORDER — HYDROCHLOROTHIAZIDE 12.5 MG PO CAPS: 12.5000 mg | ORAL_CAPSULE | Freq: Every morning | ORAL | Status: DC

## 2013-05-14 NOTE — Telephone Encounter (Signed)
Husband was standing infront of me and ask to fill hctz 12.5. Pt needs an appt but went ahead and filled med this time only

## 2013-06-23 ENCOUNTER — Telehealth: Payer: Self-pay | Admitting: Family Medicine

## 2013-06-23 MED ORDER — VALACYCLOVIR HCL 500 MG PO TABS: 500.0000 mg | ORAL_TABLET | Freq: Every morning | ORAL | Status: DC

## 2013-06-23 NOTE — Telephone Encounter (Signed)
SENT IN REFILL 

## 2013-06-23 NOTE — Telephone Encounter (Signed)
Pt is requesting refill on Valacyclovir 500 mg #90 through [facility]

## 2013-06-23 NOTE — Telephone Encounter (Signed)
SENT IN

## 2013-08-24 ENCOUNTER — Telehealth: Payer: Self-pay | Admitting: Family Medicine

## 2013-08-24 DIAGNOSIS — Z79899 Other long term (current) drug therapy: Secondary | ICD-10-CM

## 2013-08-24 DIAGNOSIS — D649 Anemia, unspecified: Secondary | ICD-10-CM

## 2013-08-24 NOTE — Telephone Encounter (Signed)
done

## 2013-08-24 NOTE — Telephone Encounter (Signed)
Order CBC Cmet and lipids.

## 2013-08-24 NOTE — Telephone Encounter (Signed)
Annette Murray is coming in Wednesday pm, for CPE. she said when she picked up her last HCTZ she had a note that she needs appt. She is going to come by Wed. Morning for labs only, needs orders entered for labs

## 2013-08-26 ENCOUNTER — Ambulatory Visit (INDEPENDENT_AMBULATORY_CARE_PROVIDER_SITE_OTHER): Payer: BC Managed Care – PPO | Admitting: Family Medicine

## 2013-08-26 ENCOUNTER — Encounter: Payer: Self-pay | Admitting: Family Medicine

## 2013-08-26 VITALS — BP 150/90 | HR 114 | Ht 61.0 in | Wt 112.0 lb

## 2013-08-26 DIAGNOSIS — F909 Attention-deficit hyperactivity disorder, unspecified type: Secondary | ICD-10-CM

## 2013-08-26 DIAGNOSIS — B009 Herpesviral infection, unspecified: Secondary | ICD-10-CM

## 2013-08-26 DIAGNOSIS — B001 Herpesviral vesicular dermatitis: Secondary | ICD-10-CM

## 2013-08-26 DIAGNOSIS — I059 Rheumatic mitral valve disease, unspecified: Secondary | ICD-10-CM

## 2013-08-26 DIAGNOSIS — F901 Attention-deficit hyperactivity disorder, predominantly hyperactive type: Secondary | ICD-10-CM

## 2013-08-26 DIAGNOSIS — Z0000 Encounter for general adult medical examination without abnormal findings: Secondary | ICD-10-CM

## 2013-08-26 DIAGNOSIS — I7300 Raynaud's syndrome without gangrene: Secondary | ICD-10-CM

## 2013-08-26 LAB — CBC WITH DIFFERENTIAL/PLATELET
Basophils Absolute: 0 10*3/uL (ref 0.0–0.1)
Eosinophils Relative: 1 % (ref 0–5)
HCT: 40 % (ref 36.0–46.0)
Lymphocytes Relative: 14 % (ref 12–46)
Lymphs Abs: 1.1 10*3/uL (ref 0.7–4.0)
MCHC: 34.5 g/dL (ref 30.0–36.0)
MCV: 94.3 fL (ref 78.0–100.0)
Monocytes Absolute: 0.5 10*3/uL (ref 0.1–1.0)
Neutro Abs: 6.5 10*3/uL (ref 1.7–7.7)
Platelets: 246 10*3/uL (ref 150–400)
RBC: 4.24 MIL/uL (ref 3.87–5.11)
RDW: 13.1 % (ref 11.5–15.5)
WBC: 8.3 10*3/uL (ref 4.0–10.5)

## 2013-08-26 MED ORDER — VALACYCLOVIR HCL 500 MG PO TABS: 500.0000 mg | ORAL_TABLET | Freq: Every morning | ORAL | Status: DC

## 2013-08-26 MED ORDER — AMPHETAMINE-DEXTROAMPHET ER 20 MG PO CP24: 20.0000 mg | ORAL_CAPSULE | ORAL | Status: DC

## 2013-08-26 MED ORDER — AMPHETAMINE-DEXTROAMPHET ER 20 MG PO CP24: 20.0000 mg | ORAL_CAPSULE | Freq: Every day | ORAL | Status: DC

## 2013-08-26 NOTE — Progress Notes (Signed)
Teaching Physician: Sharlot Gowda, MD Dictated By: Judithann Graves  Subjective:  Annette Murray is a 57 y.o. female who presents for her annual complete exam. She has two additional concerns she would like to address. She believes that her Raynaud's disease is worsening as the winter season approaches. It tends to be more severe in the winter and she has observed that the severity has increased with each year. She uses gloves as much as possible for symptomatic relief but inquires whether there may be some better method for managing her symptoms.   Since her total knee replacement last year, she has been taking daily aspirin per Ortho's recommendations. She is concerned that she bleeds and bruises very easily and asks if she may discontinue or lower the dose of her aspirin. She did have heavy menstrual flow before menopause although she has no history of epistaxis, excessive bleeding following dental procedures, nor rashes.  She continues to be satisfied with taking valtrex for control of her herpes labialis.She has a history of Mitral Valve Prolapse for which she continues to see Cardiology biannually. She has had no syncopal, nor presyncopal events and has only the occasional sensation of palpitations. She has been doing well with her total knee replacement which was performed last year. She has no functional limitations and feels fully capable of enjoying her usual physical activities. She takes Adderall XR for her ADHD and she feels that she is able to focus well on tasks at work. She notes no anorexia nor perceived rapid heart rate. She has been seen by Dr. Evelene Croon in the past and would like to switch to me for continued Adderall use. Annette Murray has a remote history of panic attacks, situational, and she has had no similar events lately. She previously had difficulty remaining asleep and she has taken trazodone with some improvement; she notes no such trouble lately.   Family and social histories were  reviewed.  ROS as in subjective.  Objective: Filed Vitals:   08/26/13 1352  BP: 150/90  Pulse: 114    Physical Exam:  BP 150/90  Pulse 114  Ht 5\' 1"  (1.549 m)  Wt 112 lb (50.803 kg)  BMI 21.17 kg/m2  General Appearance:    Alert, cooperative, no distress, appears stated age  Head:    Normocephalic, without obvious abnormality, atraumatic  Eyes:    PERRL, conjunctiva/corneas clear, EOM's intact, fundi    benign  Ears:    Normal TM's and external ear canals  Nose:   Nares normal, mucosa normal, no drainage or sinus   tenderness  Throat:   Lips, mucosa, and tongue normal; teeth and gums normal  Neck:   Supple, no lymphadenopathy;  thyroid:  no   enlargement/tenderness/nodules; no carotid   bruit or JVD  Back:    Spine nontender, no curvature, ROM normal, no CVA     tenderness  Lungs:     Clear to auscultation bilaterally without wheezes, rales or     ronchi; respirations unlabored  Chest Wall:    No tenderness or deformity   Heart:    Tachycardic, regular rhythm, S1 and S2 normal, no murmur, rub   or gallop  Breast Exam:    No tenderness, masses, or nipple discharge or inversion.      No axillary lymphadenopathy  Abdomen:     Soft, non-tender, nondistended, normoactive bowel sounds,    no masses, no hepatosplenomegaly  Extremities:   No clubbing, cyanosis or edema  Pulses:   2+ and  symmetric all extremities  Skin:   Skin color, texture, turgor normal, no rashes or lesions  Lymph nodes:   Cervical, supraclavicular, and axillary nodes normal  Neurologic:   CNII-XII intact, normal strength, sensation and gait; reflexes 2+ and symmetric throughout          Psych:   Normal mood, affect, hygiene and grooming.     Assessment and Plan: 1. Raynaud's disease Could consider trial of CCB rather than atenolol for her MVP for the CCB's beneficial peripheral vasodilatory effect, especially given beta-blockers' potential to induce vasospasm.  2. Routine general medical examination at a  health care facility - CBC with Differential - Comprehensive metabolic panel - Lipid panel  3. MITRAL VALVE PROLAPSE Not symptomatic at present. As above could consider switching to CCB from her atenolol. Will seek advice/recommendations from Cardiology.  4. ADHD (attention deficit hyperactivity disorder), predominantly hyperactive impulsive type Good focus and function at work with current treatment regimen. - amphetamine-dextroamphetamine (ADDERALL XR) 20 MG 24 hr capsule; Take 1 capsule (20 mg total) by mouth daily.  Dispense: 30 capsule; Refill: 0 - amphetamine-dextroamphetamine (ADDERALL XR) 20 MG 24 hr capsule; Take 1 capsule (20 mg total) by mouth every morning.  Dispense: 30 capsule; Refill: 0 - amphetamine-dextroamphetamine (ADDERALL XR) 20 MG 24 hr capsule; Take 1 capsule (20 mg total) by mouth every morning.  Dispense: 30 capsule; Refill: 0  5. Herpes labialis Satisfied with ongoing viral suppression with her Valacyclovir. - valACYclovir (VALTREX) 500 MG tablet; Take 1 tablet (500 mg total) by mouth every morning.  Dispense: 90 tablet; Refill: 3  6. Status post total knee replacement: With no significant cardiovascular risk factors, suggested that Annette Murray could discontinue her daily Aspirin.   Dr. Susann Givens was present for the encounter and agrees with the above assessment and plan.

## 2013-08-27 LAB — COMPREHENSIVE METABOLIC PANEL
ALT: 12 U/L (ref 0–35)
AST: 18 U/L (ref 0–37)
Albumin: 4.4 g/dL (ref 3.5–5.2)
BUN: 10 mg/dL (ref 6–23)
CO2: 30 mEq/L (ref 19–32)
Calcium: 9.4 mg/dL (ref 8.4–10.5)
Chloride: 98 mEq/L (ref 96–112)
Creat: 0.67 mg/dL (ref 0.50–1.10)
Potassium: 4.3 mEq/L (ref 3.5–5.3)
Sodium: 138 mEq/L (ref 135–145)
Total Protein: 6.9 g/dL (ref 6.0–8.3)

## 2013-08-27 LAB — LIPID PANEL
Cholesterol: 252 mg/dL — ABNORMAL HIGH (ref 0–200)
LDL Cholesterol: 134 mg/dL — ABNORMAL HIGH (ref 0–99)

## 2013-08-31 ENCOUNTER — Other Ambulatory Visit: Payer: Self-pay | Admitting: Family Medicine

## 2013-08-31 MED ORDER — DILTIAZEM HCL ER COATED BEADS 120 MG PO CP24: 120.0000 mg | ORAL_CAPSULE | Freq: Every day | ORAL | Status: DC

## 2013-08-31 MED ORDER — HYDROCHLOROTHIAZIDE 12.5 MG PO CAPS: 12.5000 mg | ORAL_CAPSULE | Freq: Every morning | ORAL | Status: DC

## 2013-08-31 NOTE — Progress Notes (Signed)
I will switch her to Cardizem CD from Tenormin. I did discuss this with her cardiologist and he is in agreement. We will see if this will help control her blood pressure and her Raynaud's disease.

## 2013-09-02 ENCOUNTER — Telehealth: Payer: Self-pay | Admitting: Family Medicine

## 2013-09-07 NOTE — Telephone Encounter (Signed)
fyi

## 2013-09-08 ENCOUNTER — Encounter: Payer: Self-pay | Admitting: Family Medicine

## 2013-09-08 ENCOUNTER — Ambulatory Visit (INDEPENDENT_AMBULATORY_CARE_PROVIDER_SITE_OTHER): Payer: BC Managed Care – PPO | Admitting: Family Medicine

## 2013-09-08 VITALS — BP 110/60 | HR 80 | Temp 98.7°F

## 2013-09-08 DIAGNOSIS — J209 Acute bronchitis, unspecified: Secondary | ICD-10-CM

## 2013-09-08 MED ORDER — AMOXICILLIN-POT CLAVULANATE 875-125 MG PO TABS: 1.0000 | ORAL_TABLET | Freq: Two times a day (BID) | ORAL | Status: DC

## 2013-09-08 NOTE — Progress Notes (Signed)
   Subjective:    Patient ID: Annette Murray, female    DOB: [birth year], 57 y.o.   MRN: [id number]  HPI 4 weeks ago she had difficulty with cough congestion, fever, malaise. She did get better but was still having difficulty with a.m. and p.m. coughing. She has had intermittent difficulty with cough and fever that usually occurs in the middle of the night. The coughing is productive. She also gets quite fatigued with this. No sore throat, earache. She does not smoke and has no underlying allergies. Several people in her immediate family have had viral syndromes, bronchitis and pneumonia.  Review of Systems     Objective:   Physical Exam alert and in no distress but toxic appearing. Tympanic membranes and canals are normal. Throat is clear. Tonsils are normal. Neck is supple without adenopathy or thyromegaly. Cardiac exam shows a regular sinus rhythm without murmurs or gallops. Lungs are clear to auscultation. Chest x-ray shows no changes       Assessment & Plan:  Acute bronchitis - Plan: amoxicillin-clavulanate (AUGMENTIN) 875-125 MG per tablet  she is to call if not entirely better when she finishes the course of the antibiotic.

## 2013-09-08 NOTE — Patient Instructions (Signed)
Take all the medicine and if not totally back to normal when you finish give me a call 

## 2013-09-22 ENCOUNTER — Ambulatory Visit: Payer: BC Managed Care – PPO | Admitting: Family Medicine

## 2013-10-08 ENCOUNTER — Telehealth: Payer: Self-pay | Admitting: Internal Medicine

## 2013-10-08 MED ORDER — DILTIAZEM HCL ER COATED BEADS 120 MG PO CP24: 120.0000 mg | ORAL_CAPSULE | Freq: Every day | ORAL | Status: DC

## 2013-10-08 NOTE — Telephone Encounter (Signed)
Pt needed a 90 day supply of diltiazem

## 2013-10-26 ENCOUNTER — Telehealth: Payer: Self-pay | Admitting: Internal Medicine

## 2013-10-26 DIAGNOSIS — F901 Attention-deficit hyperactivity disorder, predominantly hyperactive type: Secondary | ICD-10-CM

## 2013-10-26 MED ORDER — AMPHETAMINE-DEXTROAMPHET ER 20 MG PO CP24: 20.0000 mg | ORAL_CAPSULE | ORAL | Status: DC

## 2013-10-26 MED ORDER — AMPHETAMINE-DEXTROAMPHET ER 20 MG PO CP24: 20.0000 mg | ORAL_CAPSULE | Freq: Every day | ORAL | Status: DC

## 2013-10-26 NOTE — Telephone Encounter (Signed)
Pt needs a refill on 20mg  adderall. Call when ready

## 2013-10-27 ENCOUNTER — Telehealth: Payer: Self-pay | Admitting: Family Medicine

## 2013-10-27 NOTE — Telephone Encounter (Signed)
Done

## 2014-01-03 ENCOUNTER — Other Ambulatory Visit: Payer: Self-pay | Admitting: Family Medicine

## 2014-04-07 ENCOUNTER — Other Ambulatory Visit: Payer: Self-pay | Admitting: Family Medicine

## 2014-04-19 ENCOUNTER — Telehealth: Payer: Self-pay | Admitting: Family Medicine

## 2014-04-19 NOTE — Telephone Encounter (Signed)
Pt called and requested a refill on adderall xr. Pt was informed that Kendrick Fries was out of town. Please call pt at [phone] when complete.

## 2014-04-20 ENCOUNTER — Other Ambulatory Visit: Payer: Self-pay | Admitting: Medical

## 2014-04-20 DIAGNOSIS — F901 Attention-deficit hyperactivity disorder, predominantly hyperactive type: Secondary | ICD-10-CM

## 2014-04-20 MED ORDER — AMPHETAMINE-DEXTROAMPHET ER 20 MG PO CP24: 20.0000 mg | ORAL_CAPSULE | ORAL | Status: DC

## 2014-04-20 NOTE — Telephone Encounter (Signed)
rx ready 

## 2014-05-26 ENCOUNTER — Telehealth: Payer: Self-pay | Admitting: Family Medicine

## 2014-05-26 DIAGNOSIS — F901 Attention-deficit hyperactivity disorder, predominantly hyperactive type: Secondary | ICD-10-CM

## 2014-05-26 MED ORDER — AMPHETAMINE-DEXTROAMPHET ER 20 MG PO CP24: 20.0000 mg | ORAL_CAPSULE | ORAL | Status: DC

## 2014-05-26 MED ORDER — AMPHETAMINE-DEXTROAMPHET ER 20 MG PO CP24: 20.0000 mg | ORAL_CAPSULE | Freq: Every day | ORAL | Status: DC

## 2014-05-26 NOTE — Telephone Encounter (Signed)
Adderall renewed.

## 2014-07-11 ENCOUNTER — Other Ambulatory Visit: Payer: Self-pay | Admitting: Medical

## 2014-07-12 NOTE — Telephone Encounter (Signed)
Is this okay to refill? This is your patient

## 2014-09-20 ENCOUNTER — Encounter: Payer: Self-pay | Admitting: Family Medicine

## 2014-09-20 ENCOUNTER — Ambulatory Visit (INDEPENDENT_AMBULATORY_CARE_PROVIDER_SITE_OTHER): Payer: BC Managed Care – PPO | Admitting: Family Medicine

## 2014-09-20 VITALS — BP 132/70 | HR 64 | Wt 115.0 lb

## 2014-09-20 DIAGNOSIS — L819 Disorder of pigmentation, unspecified: Secondary | ICD-10-CM

## 2014-09-20 DIAGNOSIS — E785 Hyperlipidemia, unspecified: Secondary | ICD-10-CM

## 2014-09-20 DIAGNOSIS — G479 Sleep disorder, unspecified: Secondary | ICD-10-CM | POA: Insufficient documentation

## 2014-09-20 DIAGNOSIS — Z96651 Presence of right artificial knee joint: Secondary | ICD-10-CM

## 2014-09-20 DIAGNOSIS — B001 Herpesviral vesicular dermatitis: Secondary | ICD-10-CM

## 2014-09-20 DIAGNOSIS — I7300 Raynaud's syndrome without gangrene: Secondary | ICD-10-CM | POA: Insufficient documentation

## 2014-09-20 DIAGNOSIS — I10 Essential (primary) hypertension: Secondary | ICD-10-CM | POA: Insufficient documentation

## 2014-09-20 DIAGNOSIS — I059 Rheumatic mitral valve disease, unspecified: Secondary | ICD-10-CM

## 2014-09-20 DIAGNOSIS — F901 Attention-deficit hyperactivity disorder, predominantly hyperactive type: Secondary | ICD-10-CM

## 2014-09-20 DIAGNOSIS — L814 Other melanin hyperpigmentation: Secondary | ICD-10-CM | POA: Insufficient documentation

## 2014-09-20 LAB — CBC WITH DIFFERENTIAL/PLATELET
BASOS PCT: 0 % (ref 0–1)
Basophils Absolute: 0 10*3/uL (ref 0.0–0.1)
EOS ABS: 0.1 10*3/uL (ref 0.0–0.7)
EOS PCT: 1 % (ref 0–5)
HEMATOCRIT: 43.8 % (ref 36.0–46.0)
HEMOGLOBIN: 15.1 g/dL — AB (ref 12.0–15.0)
LYMPHS ABS: 1.7 10*3/uL (ref 0.7–4.0)
Lymphocytes Relative: 24 % (ref 12–46)
MCH: 32.3 pg (ref 26.0–34.0)
MCHC: 34.5 g/dL (ref 30.0–36.0)
MCV: 93.8 fL (ref 78.0–100.0)
MONO ABS: 0.7 10*3/uL (ref 0.1–1.0)
MONOS PCT: 10 % (ref 3–12)
MPV: 9.6 fL (ref 9.4–12.4)
Neutro Abs: 4.6 10*3/uL (ref 1.7–7.7)
Neutrophils Relative %: 65 % (ref 43–77)
Platelets: 294 10*3/uL (ref 150–400)
RBC: 4.67 MIL/uL (ref 3.87–5.11)
RDW: 12.7 % (ref 11.5–15.5)
WBC: 7 10*3/uL (ref 4.0–10.5)

## 2014-09-20 LAB — COMPREHENSIVE METABOLIC PANEL
ALT: 17 U/L (ref 0–35)
AST: 23 U/L (ref 0–37)
Albumin: 4.8 g/dL (ref 3.5–5.2)
Alkaline Phosphatase: 100 U/L (ref 39–117)
BILIRUBIN TOTAL: 0.5 mg/dL (ref 0.2–1.2)
BUN: 15 mg/dL (ref 6–23)
CO2: 29 meq/L (ref 19–32)
Calcium: 10 mg/dL (ref 8.4–10.5)
Chloride: 98 mEq/L (ref 96–112)
Creat: 0.88 mg/dL (ref 0.50–1.10)
GLUCOSE: 96 mg/dL (ref 70–99)
Potassium: 4.1 mEq/L (ref 3.5–5.3)
SODIUM: 138 meq/L (ref 135–145)
Total Protein: 7.3 g/dL (ref 6.0–8.3)

## 2014-09-20 LAB — LIPID PANEL
CHOL/HDL RATIO: 2.4 ratio
Cholesterol: 258 mg/dL — ABNORMAL HIGH (ref 0–200)
HDL: 107 mg/dL (ref >39)
LDL Cholesterol: 120 mg/dL — ABNORMAL HIGH (ref 0–99)
TRIGLYCERIDES: 153 mg/dL — AB (ref <150)
VLDL: 31 mg/dL (ref 0–40)

## 2014-09-20 MED ORDER — AMPHETAMINE-DEXTROAMPHET ER 20 MG PO CP24: 20.0000 mg | ORAL_CAPSULE | ORAL | Status: DC

## 2014-09-20 MED ORDER — AMPHETAMINE-DEXTROAMPHET ER 20 MG PO CP24: 20.0000 mg | ORAL_CAPSULE | Freq: Every day | ORAL | Status: DC

## 2014-09-20 MED ORDER — TRAZODONE HCL 50 MG PO TABS: 50.0000 mg | ORAL_TABLET | Freq: Every day | ORAL | Status: DC

## 2014-09-20 MED ORDER — HYDROCHLOROTHIAZIDE 12.5 MG PO CAPS: ORAL_CAPSULE | ORAL | Status: DC

## 2014-09-20 MED ORDER — HYDROQUINONE 4 % EX CREA
TOPICAL_CREAM | CUTANEOUS | Status: DC
Start: 2014-09-20 — End: 2017-05-20

## 2014-09-20 MED ORDER — DILTIAZEM HCL ER COATED BEADS 120 MG PO CP24: 120.0000 mg | ORAL_CAPSULE | Freq: Every day | ORAL | Status: DC

## 2014-09-20 MED ORDER — VALACYCLOVIR HCL 500 MG PO TABS: 500.0000 mg | ORAL_TABLET | Freq: Every morning | ORAL | Status: DC

## 2014-09-20 NOTE — Progress Notes (Signed)
   Subjective:    Patient ID: Annette Murray, female    DOB: 1955/12/28, 58 y.o.   MRN: [id number]  HPI She is here for medication check. She has a history of ADHD and continues to do quite nicely on her Adderall. It lasts for roughly 12 hours. She uses this appropriately. Her diagnosis was originally made in 2000. She was seeing Dr. Frutoso Chase at that time and followed up with Dr. Toy Care after that. She also has an underlying sleep disturbance and has been on does roll for several years with good results. She was placed on this by Dr. Toy Care. She continues on Valtrex on a regular basis. She notes when she stops the lesions do recur fairly quickly. She has a history of mitral valve prolapse but has had no chest pain, palpitations. He was record also indicates history of hyperlipidemia. She does have a history of Raynaud's disease and is doing quite nicely on Cardizem. She has had right knee TKR and is doing quite well. She was diagnosed with age spots and given a topical cream to use on it by her dermatologist and would like a refill on this.   Review of Systems     Objective:   Physical Exam alert and in no distress. Tympanic membranes and canals are normal. Throat is clear. Tonsils are normal. Neck is supple without adenopathy or thyromegaly. Cardiac exam shows a regular sinus rhythm without murmurs or gallops. Lungs are clear to auscultation.        Assessment & Plan:  Raynaud's disease - Plan: CBC with Differential, Comprehensive metabolic panel, diltiazem (CARDIZEM CD) 120 MG 24 hr capsule  Sleep disturbance - Plan: traZODone (DESYREL) 50 MG tablet  ADHD (attention deficit hyperactivity disorder), predominantly hyperactive impulsive type - Plan: amphetamine-dextroamphetamine (ADDERALL XR) 20 MG 24 hr capsule, amphetamine-dextroamphetamine (ADDERALL XR) 20 MG 24 hr capsule, amphetamine-dextroamphetamine (ADDERALL XR) 20 MG 24 hr capsule  Mitral valve disorder - Plan: CBC with Differential,  Comprehensive metabolic panel  Herpes labialis - Plan: valACYclovir (VALTREX) 500 MG tablet  Status post total right knee replacement  Essential hypertension - Plan: hydrochlorothiazide (MICROZIDE) 12.5 MG capsule, diltiazem (CARDIZEM CD) 120 MG 24 hr capsule  Age spots - Plan: hydroquinone 4 % cream  Hyperlipidemia LDL goal <100 - Plan: Lipid panel

## 2015-01-10 ENCOUNTER — Telehealth: Payer: Self-pay | Admitting: Family Medicine

## 2015-01-10 DIAGNOSIS — F901 Attention-deficit hyperactivity disorder, predominantly hyperactive type: Secondary | ICD-10-CM

## 2015-01-10 MED ORDER — AMPHETAMINE-DEXTROAMPHET ER 20 MG PO CP24: 20.0000 mg | ORAL_CAPSULE | ORAL | Status: DC

## 2015-01-10 MED ORDER — AMPHETAMINE-DEXTROAMPHET ER 20 MG PO CP24: 20.0000 mg | ORAL_CAPSULE | Freq: Every day | ORAL | Status: DC

## 2015-01-10 NOTE — Telephone Encounter (Signed)
Adderall renewed.

## 2015-03-16 ENCOUNTER — Telehealth: Payer: Self-pay | Admitting: Internal Medicine

## 2015-03-16 NOTE — Telephone Encounter (Signed)
Pt needs a refill on adderall 20mg . Call pt when ready. [phone]

## 2015-03-17 NOTE — Telephone Encounter (Signed)
She should have a prescription to be filled on June 11

## 2015-03-17 NOTE — Telephone Encounter (Signed)
Called and left message to call back.

## 2015-03-22 NOTE — Telephone Encounter (Signed)
Patient called back she said she would look again but she is planing her daughters wedding and could have mailed it to [place]. She said if she couldn't find it she would call you directly

## 2015-04-25 ENCOUNTER — Telehealth: Payer: Self-pay | Admitting: Internal Medicine

## 2015-04-25 DIAGNOSIS — F901 Attention-deficit hyperactivity disorder, predominantly hyperactive type: Secondary | ICD-10-CM

## 2015-04-25 MED ORDER — AMPHETAMINE-DEXTROAMPHET ER 20 MG PO CP24: 20.0000 mg | ORAL_CAPSULE | ORAL | Status: DC

## 2015-04-25 MED ORDER — AMPHETAMINE-DEXTROAMPHET ER 20 MG PO CP24: 20.0000 mg | ORAL_CAPSULE | Freq: Every day | ORAL | Status: DC

## 2015-04-25 NOTE — Telephone Encounter (Signed)
Pt needs a refill on adderall 20mg . Call when ready @ [phone]

## 2015-05-02 LAB — HM MAMMOGRAPHY: HM MAMMO: NORMAL

## 2015-06-14 ENCOUNTER — Other Ambulatory Visit: Payer: Self-pay | Admitting: Obstetrics and Gynecology

## 2015-06-15 LAB — CYTOLOGY - PAP

## 2015-08-09 ENCOUNTER — Telehealth: Payer: Self-pay | Admitting: Family Medicine

## 2015-08-09 DIAGNOSIS — F901 Attention-deficit hyperactivity disorder, predominantly hyperactive type: Secondary | ICD-10-CM

## 2015-08-09 MED ORDER — AMPHETAMINE-DEXTROAMPHET ER 20 MG PO CP24
20.0000 mg | ORAL_CAPSULE | ORAL | Status: DC
Start: 2015-10-09 — End: 2016-01-20

## 2015-08-09 MED ORDER — AMPHETAMINE-DEXTROAMPHET ER 20 MG PO CP24: 20.0000 mg | ORAL_CAPSULE | ORAL | Status: DC

## 2015-08-09 MED ORDER — AMPHETAMINE-DEXTROAMPHET ER 20 MG PO CP24
20.0000 mg | ORAL_CAPSULE | Freq: Every day | ORAL | Status: DC
Start: 2015-09-08 — End: 2015-10-18

## 2015-08-09 NOTE — Telephone Encounter (Signed)
Pt made a cpe appt for January needs refills on adderall xr. Please call [phone] when ready.

## 2015-09-16 ENCOUNTER — Other Ambulatory Visit: Payer: Self-pay | Admitting: Family Medicine

## 2015-09-16 NOTE — Telephone Encounter (Signed)
Is this ok to refill?  

## 2015-09-16 NOTE — Telephone Encounter (Signed)
LM to CB and schedule

## 2015-09-16 NOTE — Telephone Encounter (Signed)
Its time for an office visit 

## 2015-09-21 ENCOUNTER — Telehealth: Payer: Self-pay

## 2015-09-21 DIAGNOSIS — G479 Sleep disorder, unspecified: Secondary | ICD-10-CM

## 2015-09-21 NOTE — Telephone Encounter (Signed)
Pt has physical 10/18/15 and has 3 Trazadone left. You said it was time for an appt. Can we just keep the physical appt and give her some until then or do you want to have her in sooner?

## 2015-09-22 MED ORDER — TRAZODONE HCL 50 MG PO TABS: 50.0000 mg | ORAL_TABLET | Freq: Every day | ORAL | Status: DC

## 2015-09-22 NOTE — Telephone Encounter (Signed)
She has a physical on the 17th can we refill until her appt or should I get her in sooner? I sent a note asking about this yesterday.

## 2015-09-22 NOTE — Telephone Encounter (Signed)
I called it in 

## 2015-10-18 ENCOUNTER — Ambulatory Visit (INDEPENDENT_AMBULATORY_CARE_PROVIDER_SITE_OTHER): Payer: BLUE CROSS/BLUE SHIELD | Admitting: Family Medicine

## 2015-10-18 ENCOUNTER — Encounter: Payer: Self-pay | Admitting: *Deleted

## 2015-10-18 ENCOUNTER — Encounter: Payer: Self-pay | Admitting: Family Medicine

## 2015-10-18 VITALS — BP 118/70 | HR 68 | Temp 98.3°F | Ht 62.0 in | Wt 115.8 lb

## 2015-10-18 DIAGNOSIS — I10 Essential (primary) hypertension: Secondary | ICD-10-CM | POA: Diagnosis not present

## 2015-10-18 DIAGNOSIS — Z1159 Encounter for screening for other viral diseases: Secondary | ICD-10-CM | POA: Diagnosis not present

## 2015-10-18 DIAGNOSIS — E785 Hyperlipidemia, unspecified: Secondary | ICD-10-CM

## 2015-10-18 DIAGNOSIS — Z800 Family history of malignant neoplasm of digestive organs: Secondary | ICD-10-CM | POA: Insufficient documentation

## 2015-10-18 DIAGNOSIS — F901 Attention-deficit hyperactivity disorder, predominantly hyperactive type: Secondary | ICD-10-CM | POA: Diagnosis not present

## 2015-10-18 DIAGNOSIS — Z0000 Encounter for general adult medical examination without abnormal findings: Secondary | ICD-10-CM

## 2015-10-18 DIAGNOSIS — I7300 Raynaud's syndrome without gangrene: Secondary | ICD-10-CM

## 2015-10-18 DIAGNOSIS — I059 Rheumatic mitral valve disease, unspecified: Secondary | ICD-10-CM

## 2015-10-18 DIAGNOSIS — Z860100 Personal history of colon polyps, unspecified: Secondary | ICD-10-CM

## 2015-10-18 DIAGNOSIS — G479 Sleep disorder, unspecified: Secondary | ICD-10-CM | POA: Diagnosis not present

## 2015-10-18 DIAGNOSIS — Z96651 Presence of right artificial knee joint: Secondary | ICD-10-CM | POA: Diagnosis not present

## 2015-10-18 LAB — CBC WITH DIFFERENTIAL/PLATELET
BASOS ABS: 0 10*3/uL (ref 0.0–0.1)
BASOS PCT: 1 % (ref 0–1)
EOS ABS: 0 10*3/uL (ref 0.0–0.7)
Eosinophils Relative: 1 % (ref 0–5)
HCT: 42.9 % (ref 36.0–46.0)
Hemoglobin: 14.9 g/dL (ref 12.0–15.0)
LYMPHS ABS: 1.2 10*3/uL (ref 0.7–4.0)
Lymphocytes Relative: 28 % (ref 12–46)
MCH: 33.3 pg (ref 26.0–34.0)
MCHC: 34.7 g/dL (ref 30.0–36.0)
MCV: 95.8 fL (ref 78.0–100.0)
MPV: 9.8 fL (ref 8.6–12.4)
Monocytes Absolute: 0.3 10*3/uL (ref 0.1–1.0)
Monocytes Relative: 8 % (ref 3–12)
NEUTROS ABS: 2.6 10*3/uL (ref 1.7–7.7)
NEUTROS PCT: 62 % (ref 43–77)
PLATELETS: 213 10*3/uL (ref 150–400)
RBC: 4.48 MIL/uL (ref 3.87–5.11)
RDW: 13.1 % (ref 11.5–15.5)
WBC: 4.2 10*3/uL (ref 4.0–10.5)

## 2015-10-18 LAB — LIPID PANEL
Cholesterol: 248 mg/dL — ABNORMAL HIGH (ref 125–200)
HDL: 131 mg/dL (ref >=46)
LDL Cholesterol: 100 mg/dL (ref <130)
Total CHOL/HDL Ratio: 1.9 Ratio (ref <=5.0)
Triglycerides: 87 mg/dL (ref <150)
VLDL: 17 mg/dL (ref <30)

## 2015-10-18 LAB — COMPREHENSIVE METABOLIC PANEL
ALK PHOS: 77 U/L (ref 33–130)
ALT: 14 U/L (ref 6–29)
AST: 19 U/L (ref 10–35)
Albumin: 4.2 g/dL (ref 3.6–5.1)
BILIRUBIN TOTAL: 0.6 mg/dL (ref 0.2–1.2)
BUN: 8 mg/dL (ref 7–25)
CO2: 28 mmol/L (ref 20–31)
CREATININE: 0.74 mg/dL (ref 0.50–1.05)
Calcium: 9.4 mg/dL (ref 8.6–10.4)
Chloride: 101 mmol/L (ref 98–110)
Glucose, Bld: 98 mg/dL (ref 65–99)
Potassium: 4.3 mmol/L (ref 3.5–5.3)
SODIUM: 139 mmol/L (ref 135–146)
TOTAL PROTEIN: 6.3 g/dL (ref 6.1–8.1)

## 2015-10-18 MED ORDER — HYDROCHLOROTHIAZIDE 12.5 MG PO CAPS: ORAL_CAPSULE | ORAL | Status: DC

## 2015-10-18 MED ORDER — DILTIAZEM HCL ER COATED BEADS 120 MG PO CP24: 120.0000 mg | ORAL_CAPSULE | Freq: Every day | ORAL | Status: DC

## 2015-10-18 MED ORDER — METHYLPHENIDATE HCL ER (OSM) 18 MG PO TBCR: 18.0000 mg | EXTENDED_RELEASE_TABLET | Freq: Every day | ORAL | Status: DC

## 2015-10-18 MED ORDER — TRAZODONE HCL 50 MG PO TABS: 50.0000 mg | ORAL_TABLET | Freq: Every day | ORAL | Status: DC

## 2015-10-18 NOTE — Progress Notes (Signed)
Subjective:    Patient ID: Annette Murray, female    DOB: [birth year], 60 y.o.   MRN: [id number]  HPI She is here for complete examination. She does have hypertension and Raynaud's disease and finds that the Cardizem is working well for her hypertension and specifically she is having less difficulty with the Raynaud's. She has not had any difficulty with cardiac irregularity from her underlying the MVP. She is having no difficulty to her knee replacement.She continues had difficulty with sleep disturbance and continues on does real. She was placed on this by Dr. Toy Care several years ago. She does have underlying ADD and has been using Adderall but does state that it makes her heart rate speed up.She has no other concerns or complaints. No sneezing, itchy watery eyes, chest pain, GI issues. Her health maintenance, social and family history and immunizations were reviewed. Her marriage is going well. Her 3 children seen be doing well. Her exercise is unfortunately quite minimal. She sees her gynecologist regularly. She will have a colonoscopy later this year. She does have a previous history of colonic polyps and a family history of colon cancer  Review of Systems  All other systems reviewed and are negative.      Objective:   Physical Exam BP 118/70 mmHg  Pulse 68  Temp(Src) 98.3 F (36.8 C) (Oral)  Ht 5\' 2"  (1.575 m)  Wt 115 lb 12.8 oz (52.527 kg)  BMI 21.17 kg/m2  General Appearance:    Alert, cooperative, no distress, appears stated age  Head:    Normocephalic, without obvious abnormality, atraumatic  Eyes:    PERRL, conjunctiva/corneas clear, EOM's intact, fundi    benign  Ears:    Normal TM's and external ear canals  Nose:   Nares normal, mucosa normal, no drainage or sinus   tenderness  Throat:   Lips, mucosa, and tongue normal; teeth and gums normal  Neck:   Supple, no lymphadenopathy;  thyroid:  no   enlargement/tenderness/nodules; no carotid   bruit or JVD  Back:    Spine  nontender, no curvature, ROM normal, no CVA     tenderness  Lungs:     Clear to auscultation bilaterally without wheezes, rales or     ronchi; respirations unlabored  Chest Wall:    No tenderness or deformity   Heart:    Regular rate and rhythm, S1 and S2 normal, no murmur, rub   or gallop  Breast Exam:    Deferred to GYN  Abdomen:     Soft, non-tender, nondistended, normoactive bowel sounds,    no masses, no hepatosplenomegaly  Genitalia:    Deferred to GYN     Extremities:   No clubbing, cyanosis or edema  Pulses:   2+ and symmetric all extremities  Skin:   Skin color, texture, turgor normal, no rashes or lesions  Lymph nodes:   Cervical, supraclavicular, and axillary nodes normal  Neurologic:   CNII-XII intact, normal strength, sensation and gait; reflexes 2+ and symmetric throughout          Psych:   Normal mood, affect, hygiene and grooming.          Assessment & Plan:  Routine general medical examination at a health care facility - Plan: CBC with Differential/Platelet, Comprehensive metabolic panel, Lipid panel  ADHD (attention deficit hyperactivity disorder), predominantly hyperactive impulsive type - Plan: methylphenidate (CONCERTA) 18 MG PO CR tablet  Sleep disturbance  Raynaud's disease  Mitral valve disorder  Status post total right  knee replacement  Essential hypertension - Plan: CBC with Differential/Platelet, Comprehensive metabolic panel  Hyperlipidemia LDL goal <100 - Plan: Hepatitis C antibody  Need for hepatitis C screening test - Plan: Hepatitis C antibody  Family history of colon cancer  History of colonic polyps I will try her on Concerta to see if she gets a longer benefit as well as decreased his symptoms. She will let me know how this works. Discussed sleep disturbance with her in terms of clearing her mind so she can go to sleep at night. She will continue on her present medications.I also discussed having a huddle while at work to see if this will  help with removing some of her work stresses.

## 2015-10-18 NOTE — Patient Instructions (Signed)
Spend sometime before you go to bed to write everything down to get it out either head\ Half midday Huddles and see if it will help

## 2015-10-19 LAB — HEPATITIS C ANTIBODY: HCV AB: NEGATIVE

## 2015-11-04 ENCOUNTER — Other Ambulatory Visit: Payer: Self-pay | Admitting: Family Medicine

## 2015-11-28 ENCOUNTER — Other Ambulatory Visit: Payer: Self-pay | Admitting: Family Medicine

## 2015-12-15 ENCOUNTER — Encounter: Payer: Self-pay | Admitting: Family Medicine

## 2015-12-28 ENCOUNTER — Ambulatory Visit
Admission: RE | Admit: 2015-12-28 | Discharge: 2015-12-28 | Disposition: A | Discharge status: Home / Self Care | Payer: BLUE CROSS/BLUE SHIELD | Source: Ambulatory Visit | Attending: Family Medicine | Admitting: Family Medicine

## 2015-12-28 ENCOUNTER — Ambulatory Visit (INDEPENDENT_AMBULATORY_CARE_PROVIDER_SITE_OTHER): Payer: BLUE CROSS/BLUE SHIELD | Admitting: Family Medicine

## 2015-12-28 ENCOUNTER — Encounter: Payer: Self-pay | Admitting: Family Medicine

## 2015-12-28 VITALS — BP 120/76 | HR 92 | Temp 98.4°F | Resp 16 | Wt 114.6 lb

## 2015-12-28 DIAGNOSIS — R05 Cough: Secondary | ICD-10-CM | POA: Diagnosis not present

## 2015-12-28 DIAGNOSIS — R053 Chronic cough: Secondary | ICD-10-CM

## 2015-12-28 DIAGNOSIS — R509 Fever, unspecified: Secondary | ICD-10-CM | POA: Diagnosis not present

## 2015-12-28 LAB — CBC WITH DIFFERENTIAL/PLATELET
Basophils Absolute: 0 10*3/uL (ref 0.0–0.1)
Basophils Relative: 0 % (ref 0–1)
EOS ABS: 0.1 10*3/uL (ref 0.0–0.7)
EOS PCT: 1 % (ref 0–5)
HCT: 42.6 % (ref 36.0–46.0)
Hemoglobin: 14.6 g/dL (ref 12.0–15.0)
LYMPHS ABS: 2 10*3/uL (ref 0.7–4.0)
Lymphocytes Relative: 28 % (ref 12–46)
MCH: 32.4 pg (ref 26.0–34.0)
MCHC: 34.3 g/dL (ref 30.0–36.0)
MCV: 94.5 fL (ref 78.0–100.0)
MONOS PCT: 9 % (ref 3–12)
MPV: 9.3 fL (ref 8.6–12.4)
Monocytes Absolute: 0.6 10*3/uL (ref 0.1–1.0)
Neutro Abs: 4.3 10*3/uL (ref 1.7–7.7)
Neutrophils Relative %: 62 % (ref 43–77)
PLATELETS: 288 10*3/uL (ref 150–400)
RBC: 4.51 MIL/uL (ref 3.87–5.11)
RDW: 12.7 % (ref 11.5–15.5)
WBC: 7 10*3/uL (ref 4.0–10.5)

## 2015-12-28 MED ORDER — ALBUTEROL SULFATE HFA 108 (90 BASE) MCG/ACT IN AERS: 2.0000 | INHALATION_SPRAY | Freq: Four times a day (QID) | RESPIRATORY_TRACT | Status: DC | PRN

## 2015-12-28 MED ORDER — AZITHROMYCIN 250 MG PO TABS: ORAL_TABLET | ORAL | Status: DC

## 2015-12-28 MED ORDER — PROMETHAZINE-DM 6.25-15 MG/5ML PO SYRP
5.0000 mL | ORAL_SOLUTION | Freq: Every evening | ORAL | Status: DC | PRN
Start: 2015-12-28 — End: 2016-01-05

## 2015-12-28 NOTE — Progress Notes (Signed)
   Subjective:    Patient ID: Annette Murray, female    DOB: [birth year], 60 y.o.   MRN: [id number]  HPI Chief Complaint  Patient presents with  . coughing    coughing- in the morning and night. if up running around then will start coughing. spiking fever every other day. every wednesday and saturday fever is usually 100-101. had flu march 7th. works around [employer]   She is here with complaints of cough that is worse in morning and night, productive of yellowish sputum. During the day her cough is fine. Left ear feels stopped up.  Intermittent fever for past 3 weeks.  States she feels 100% better but is not sure why her cough and fever is lingering. Denies headache, sinus congestion or drainage, sore throat, chest pain or shortness of breath.   States she had influenza B early March. Her husband also had flu and ended up with pneumonia and was hospitalized.   Does not smoke, has seasonal allergies and takes daily Zyrtec in the spring. Has had bronchitis and pneumonia about 4 years ago. No recent antibiotic use. Runs a childcare center.   Taking Robitussin DM for cough and Ibuprofen 600mg  for fever. States cough medication is not helping at night anymore.   Reviewed allergies, medications, past medical and social history.   Review of Systems Pertinent positives and negatives in the history of present illness.     Objective:   Physical Exam BP 120/76 mmHg  Pulse 92  Temp(Src) 98.4 F (36.9 C) (Oral)  Resp 16  Wt 114 lb 9.6 oz (51.982 kg)  SpO2 98%  Alert and in no distress. No sinus tenderness. Nares with edema and erythema, R>L. Tympanic membranes and canals are normal. Pharyngeal area is normal. Neck is supple without adenopathy or thyromegaly. Cardiac exam shows a regular sinus rhythm without murmurs or gallops. Lungs are clear to auscultation.      Assessment & Plan:  Persistent cough for 3 weeks or longer - Plan: DG Chest 2 View, albuterol (PROVENTIL HFA;VENTOLIN HFA)  108 (90 Base) MCG/ACT inhaler, CBC with Differential/Platelet  Intermittent fever - Plan: CBC with Differential/Platelet  Discussed that her symptoms may be related to a bacterial infection since cough is related to intermittent fever and will send her for chest x-ray to rule out pneumonia. Z-pak prescribed. Promethazine DM prescribed to take at bedtime for cough with instructions to avoid alcohol or driving as this is sedating. Albuterol inhaler prescribed to use as needed for cough. Will follow up pending results.

## 2015-12-28 NOTE — Patient Instructions (Addendum)
Continue treating your symptoms. You can take over-the-counter Delsym or Mucinex DM. Tylenol or ibuprofen for fever and pain. Take the prescription cough medication only at bedtime and as we discussed do not drive or drink alcohol with it, it is sedating. We will call you with lab and x-ray results. Use the albuterol inhaler as needed for cough. You can pick up the antibiotic and hold this until I call you with results.

## 2016-01-05 ENCOUNTER — Ambulatory Visit (INDEPENDENT_AMBULATORY_CARE_PROVIDER_SITE_OTHER): Payer: BLUE CROSS/BLUE SHIELD | Admitting: Family Medicine

## 2016-01-05 VITALS — Wt 115.6 lb

## 2016-01-05 DIAGNOSIS — H6122 Impacted cerumen, left ear: Secondary | ICD-10-CM | POA: Diagnosis not present

## 2016-01-05 DIAGNOSIS — H6592 Unspecified nonsuppurative otitis media, left ear: Secondary | ICD-10-CM | POA: Diagnosis not present

## 2016-01-05 MED ORDER — AMOXICILLIN 875 MG PO TABS: 875.0000 mg | ORAL_TABLET | Freq: Two times a day (BID) | ORAL | Status: DC

## 2016-01-05 NOTE — Progress Notes (Signed)
   Subjective:    Patient ID: Ancil Linsey, female    DOB: 1956-07-03, 60 y.o.   MRN: [id number]  HPI Chief Complaint  Patient presents with  . ear wax    ear wax build up. ear pain when swallowing   She is here with complaints of left ear feeling clogged and impacted. She was seen here for sinus issues and cough last week and states she is feeling much better. She did not take prescribed Z pak.  States left ear started feeling clogged 2 days ago and today started hurting.  Denies fever, chills, sinus pressure, sore throat.    Review of Systems Pertinent positives and negatives in the history of present illness.     Objective:   Physical Exam Wt 115 lb 9.6 oz (52.436 kg)  Alert and in no distress. No sinus tenderness. Right ympanic membrane and canal is normal, unable to visualize left TM, canal with significant cerumen. Post lavage left ear reveals malleus with erythema and dull TM.  Pharyngeal area is normal. Neck is supple without adenopathy or thyromegaly.       Assessment & Plan:  Cerumen impaction, left  Left non-suppurative otitis media, recurrence not specified - Plan: amoxicillin (AMOXIL) 875 MG tablet  She is doing significantly better and did not take antibiotic. She does appear to have acute infection and will treat with antibiotic. Amoxil sent to pharmacy.  Left ear lavage performed by Gabriel Cirri, CMA, for cerumen impaction. This provider also used curette to remove impaction.  Patient tolerated procedure well.

## 2016-01-20 ENCOUNTER — Telehealth: Payer: Self-pay | Admitting: Family Medicine

## 2016-01-20 DIAGNOSIS — F901 Attention-deficit hyperactivity disorder, predominantly hyperactive type: Secondary | ICD-10-CM

## 2016-01-20 MED ORDER — AMPHETAMINE-DEXTROAMPHET ER 20 MG PO CP24: 20.0000 mg | ORAL_CAPSULE | ORAL | Status: DC

## 2016-01-20 NOTE — Telephone Encounter (Signed)
Left message that rx was ready to be picked up. 

## 2016-01-20 NOTE — Telephone Encounter (Signed)
Requesting refill on Adderall 20 mg. She tried Concerta and she did not like it, it did not work well. Call [phone] when script ready for pick up

## 2016-04-25 ENCOUNTER — Telehealth: Payer: Self-pay | Admitting: Family Medicine

## 2016-04-25 DIAGNOSIS — F901 Attention-deficit hyperactivity disorder, predominantly hyperactive type: Secondary | ICD-10-CM

## 2016-04-25 MED ORDER — AMPHETAMINE-DEXTROAMPHET ER 20 MG PO CP24: 20.0000 mg | ORAL_CAPSULE | ORAL | 0 refills | Status: DC

## 2016-04-25 NOTE — Telephone Encounter (Signed)
Needs rx refill adderal  Please call when ready

## 2016-06-09 ENCOUNTER — Other Ambulatory Visit: Payer: Self-pay | Admitting: Family Medicine

## 2016-06-09 DIAGNOSIS — B029 Zoster without complications: Secondary | ICD-10-CM

## 2016-06-09 MED ORDER — HYDROCODONE-ACETAMINOPHEN 5-325 MG PO TABS: 1.0000 | ORAL_TABLET | Freq: Four times a day (QID) | ORAL | 0 refills | Status: DC | PRN

## 2016-06-15 ENCOUNTER — Other Ambulatory Visit: Payer: Self-pay | Admitting: Medical

## 2016-06-15 ENCOUNTER — Telehealth: Payer: Self-pay | Admitting: Family Medicine

## 2016-06-15 DIAGNOSIS — B029 Zoster without complications: Secondary | ICD-10-CM

## 2016-06-15 MED ORDER — HYDROCODONE-ACETAMINOPHEN 5-325 MG PO TABS: 1.0000 | ORAL_TABLET | Freq: Four times a day (QID) | ORAL | 0 refills | Status: DC | PRN

## 2016-06-15 NOTE — Telephone Encounter (Signed)
Pt called and states that she is still having some pain with the shingles, she see Dr Redmond School over the weekend and he gave some hydrocodone and she has been taking them 1 every 4 hours for pain, she has 5 left and would like a refill on them, pt can be reached at [phone] and [facility] uses [id number] - [place],  - [address] informed her that dr Redmond School was out of the office.

## 2016-06-15 NOTE — Progress Notes (Signed)
Annette Murray spoke to Dr. Redmond School.  He did in fact see her for shingles, ok'd script.   Refilled hydrocodone today

## 2016-06-15 NOTE — Telephone Encounter (Signed)
Called and talked to DR lalonde and he ok the refill for her RX

## 2016-06-15 NOTE — Telephone Encounter (Signed)
done

## 2016-06-15 NOTE — Telephone Encounter (Signed)
There is no documentation in the chart for shingles at all.  So I would need to see her .

## 2016-06-25 ENCOUNTER — Telehealth: Payer: Self-pay | Admitting: Family Medicine

## 2016-06-25 DIAGNOSIS — B029 Zoster without complications: Secondary | ICD-10-CM

## 2016-06-25 MED ORDER — HYDROCODONE-ACETAMINOPHEN 5-325 MG PO TABS: 1.0000 | ORAL_TABLET | Freq: Four times a day (QID) | ORAL | 0 refills | Status: DC | PRN

## 2016-06-25 NOTE — Telephone Encounter (Signed)
She continues to have herpetic pain. I will switch her to 2 Aleve twice per day and give her codeine to try and help tide her over.

## 2016-06-25 NOTE — Telephone Encounter (Signed)
Pt called and was wanting to know if she could get a refill on her [facility] she states she is still having some pain from the shingles,the pain goes from her fingers to her shoulders, the pain medicine eases for her to function, she is not for sure if you want her to come back in, pt can be reached [phone] [facility] uses [id number] - [place], [place] - [address]

## 2016-06-28 DIAGNOSIS — Z1231 Encounter for screening mammogram for malignant neoplasm of breast: Secondary | ICD-10-CM | POA: Diagnosis not present

## 2016-07-03 ENCOUNTER — Other Ambulatory Visit: Payer: Self-pay | Admitting: Obstetrics and Gynecology

## 2016-07-03 ENCOUNTER — Other Ambulatory Visit: Payer: Self-pay | Admitting: Family Medicine

## 2016-07-03 DIAGNOSIS — R928 Other abnormal and inconclusive findings on diagnostic imaging of breast: Secondary | ICD-10-CM

## 2016-07-04 MED ORDER — VALACYCLOVIR HCL 1 G PO TABS: 1000.0000 mg | ORAL_TABLET | Freq: Three times a day (TID) | ORAL | 3 refills | Status: DC

## 2016-07-06 ENCOUNTER — Ambulatory Visit
Admission: RE | Admit: 2016-07-06 | Discharge: 2016-07-06 | Disposition: A | Discharge status: Home / Self Care | Payer: BLUE CROSS/BLUE SHIELD | Source: Ambulatory Visit | Attending: Obstetrics and Gynecology | Admitting: Obstetrics and Gynecology

## 2016-07-06 DIAGNOSIS — N6489 Other specified disorders of breast: Secondary | ICD-10-CM | POA: Diagnosis not present

## 2016-07-06 DIAGNOSIS — R928 Other abnormal and inconclusive findings on diagnostic imaging of breast: Secondary | ICD-10-CM

## 2016-07-21 ENCOUNTER — Other Ambulatory Visit: Payer: Self-pay | Admitting: Family Medicine

## 2016-07-21 DIAGNOSIS — G479 Sleep disorder, unspecified: Secondary | ICD-10-CM

## 2016-07-23 DIAGNOSIS — R319 Hematuria, unspecified: Secondary | ICD-10-CM | POA: Diagnosis not present

## 2016-07-23 DIAGNOSIS — Z6820 Body mass index (BMI) 20.0-20.9, adult: Secondary | ICD-10-CM | POA: Diagnosis not present

## 2016-07-23 DIAGNOSIS — Z01419 Encounter for gynecological examination (general) (routine) without abnormal findings: Secondary | ICD-10-CM | POA: Diagnosis not present

## 2016-07-23 DIAGNOSIS — N390 Urinary tract infection, site not specified: Secondary | ICD-10-CM | POA: Diagnosis not present

## 2016-07-23 NOTE — Telephone Encounter (Signed)
Is this okay to refill? 

## 2016-08-03 DIAGNOSIS — Z23 Encounter for immunization: Secondary | ICD-10-CM | POA: Diagnosis not present

## 2016-08-20 DIAGNOSIS — Z1211 Encounter for screening for malignant neoplasm of colon: Secondary | ICD-10-CM | POA: Diagnosis not present

## 2016-08-20 DIAGNOSIS — Z800 Family history of malignant neoplasm of digestive organs: Secondary | ICD-10-CM | POA: Diagnosis not present

## 2016-08-20 DIAGNOSIS — D124 Benign neoplasm of descending colon: Secondary | ICD-10-CM | POA: Diagnosis not present

## 2016-08-20 DIAGNOSIS — D126 Benign neoplasm of colon, unspecified: Secondary | ICD-10-CM | POA: Diagnosis not present

## 2016-08-20 LAB — HM COLONOSCOPY

## 2016-08-27 ENCOUNTER — Telehealth: Payer: Self-pay | Admitting: Family Medicine

## 2016-08-27 DIAGNOSIS — F901 Attention-deficit hyperactivity disorder, predominantly hyperactive type: Secondary | ICD-10-CM

## 2016-08-27 MED ORDER — AMPHETAMINE-DEXTROAMPHET ER 20 MG PO CP24: 20.0000 mg | ORAL_CAPSULE | ORAL | 0 refills | Status: DC

## 2016-08-27 NOTE — Telephone Encounter (Signed)
Pt called and is requesting a refill on her adderral pt can be reached at [phone]

## 2016-08-28 ENCOUNTER — Telehealth: Payer: Self-pay | Admitting: Family Medicine

## 2016-08-28 DIAGNOSIS — Z1211 Encounter for screening for malignant neoplasm of colon: Secondary | ICD-10-CM | POA: Diagnosis not present

## 2016-08-28 DIAGNOSIS — D126 Benign neoplasm of colon, unspecified: Secondary | ICD-10-CM | POA: Diagnosis not present

## 2016-08-28 NOTE — Telephone Encounter (Signed)
Left message informing pt that rx is ready

## 2016-09-11 ENCOUNTER — Encounter: Payer: Self-pay | Admitting: Family Medicine

## 2016-09-21 ENCOUNTER — Encounter: Payer: Self-pay | Admitting: Family Medicine

## 2016-12-21 ENCOUNTER — Telehealth: Payer: Self-pay | Admitting: Family Medicine

## 2016-12-21 DIAGNOSIS — F901 Attention-deficit hyperactivity disorder, predominantly hyperactive type: Secondary | ICD-10-CM

## 2016-12-21 NOTE — Telephone Encounter (Signed)
Pt needs refill Adderall

## 2016-12-23 MED ORDER — AMPHETAMINE-DEXTROAMPHET ER 20 MG PO CP24
20.0000 mg | ORAL_CAPSULE | ORAL | 0 refills | Status: DC
Start: 2017-01-23 — End: 2017-04-09

## 2016-12-23 MED ORDER — AMPHETAMINE-DEXTROAMPHET ER 20 MG PO CP24: 20.0000 mg | ORAL_CAPSULE | ORAL | 0 refills | Status: DC

## 2016-12-24 ENCOUNTER — Telehealth: Payer: Self-pay

## 2016-12-24 NOTE — Telephone Encounter (Signed)
Lm on V[facility] that script ready for pick up. Annette Murray

## 2016-12-25 ENCOUNTER — Other Ambulatory Visit: Payer: Self-pay | Admitting: Family Medicine

## 2017-03-03 ENCOUNTER — Other Ambulatory Visit: Payer: Self-pay | Admitting: Family Medicine

## 2017-03-03 DIAGNOSIS — G479 Sleep disorder, unspecified: Secondary | ICD-10-CM

## 2017-04-09 ENCOUNTER — Telehealth: Payer: Self-pay | Admitting: Family Medicine

## 2017-04-09 DIAGNOSIS — F901 Attention-deficit hyperactivity disorder, predominantly hyperactive type: Secondary | ICD-10-CM

## 2017-04-09 MED ORDER — AMPHETAMINE-DEXTROAMPHET ER 20 MG PO CP24: 20.0000 mg | ORAL_CAPSULE | ORAL | 0 refills | Status: DC

## 2017-04-09 NOTE — Telephone Encounter (Signed)
Pt called requesting a refill on her adderall pt can be reached at [phone] please call when ready to be picked up

## 2017-04-10 ENCOUNTER — Other Ambulatory Visit: Payer: Self-pay | Admitting: Family Medicine

## 2017-04-16 ENCOUNTER — Other Ambulatory Visit: Payer: Self-pay | Admitting: Obstetrics and Gynecology

## 2017-04-16 DIAGNOSIS — N6489 Other specified disorders of breast: Secondary | ICD-10-CM

## 2017-04-22 ENCOUNTER — Ambulatory Visit
Admission: RE | Admit: 2017-04-22 | Discharge: 2017-04-22 | Disposition: A | Discharge status: Home / Self Care | Payer: BLUE CROSS/BLUE SHIELD | Source: Ambulatory Visit | Attending: Obstetrics and Gynecology | Admitting: Obstetrics and Gynecology

## 2017-04-22 DIAGNOSIS — R928 Other abnormal and inconclusive findings on diagnostic imaging of breast: Secondary | ICD-10-CM | POA: Diagnosis not present

## 2017-04-22 DIAGNOSIS — N6489 Other specified disorders of breast: Secondary | ICD-10-CM

## 2017-05-20 ENCOUNTER — Encounter: Payer: Self-pay | Admitting: Family Medicine

## 2017-05-20 ENCOUNTER — Ambulatory Visit (INDEPENDENT_AMBULATORY_CARE_PROVIDER_SITE_OTHER): Payer: BLUE CROSS/BLUE SHIELD | Admitting: Family Medicine

## 2017-05-20 VITALS — HR 74 | Wt 117.8 lb

## 2017-05-20 DIAGNOSIS — J309 Allergic rhinitis, unspecified: Secondary | ICD-10-CM | POA: Diagnosis not present

## 2017-05-20 DIAGNOSIS — I10 Essential (primary) hypertension: Secondary | ICD-10-CM

## 2017-05-20 DIAGNOSIS — F901 Attention-deficit hyperactivity disorder, predominantly hyperactive type: Secondary | ICD-10-CM

## 2017-05-20 DIAGNOSIS — L814 Other melanin hyperpigmentation: Secondary | ICD-10-CM | POA: Diagnosis not present

## 2017-05-20 DIAGNOSIS — B001 Herpesviral vesicular dermatitis: Secondary | ICD-10-CM

## 2017-05-20 DIAGNOSIS — Z23 Encounter for immunization: Secondary | ICD-10-CM

## 2017-05-20 DIAGNOSIS — Z79899 Other long term (current) drug therapy: Secondary | ICD-10-CM | POA: Diagnosis not present

## 2017-05-20 DIAGNOSIS — E785 Hyperlipidemia, unspecified: Secondary | ICD-10-CM

## 2017-05-20 DIAGNOSIS — Z96651 Presence of right artificial knee joint: Secondary | ICD-10-CM | POA: Diagnosis not present

## 2017-05-20 DIAGNOSIS — Z800 Family history of malignant neoplasm of digestive organs: Secondary | ICD-10-CM | POA: Diagnosis not present

## 2017-05-20 DIAGNOSIS — I059 Rheumatic mitral valve disease, unspecified: Secondary | ICD-10-CM | POA: Diagnosis not present

## 2017-05-20 DIAGNOSIS — I7300 Raynaud's syndrome without gangrene: Secondary | ICD-10-CM

## 2017-05-20 LAB — CBC WITH DIFFERENTIAL/PLATELET
BASOS PCT: 0 %
Basophils Absolute: 0 cells/uL (ref 0–200)
EOS PCT: 1 %
Eosinophils Absolute: 44 cells/uL (ref 15–500)
HCT: 41.2 % (ref 35.0–45.0)
HEMOGLOBIN: 14.5 g/dL (ref 11.7–15.5)
LYMPHS ABS: 1760 {cells}/uL (ref 850–3900)
Lymphocytes Relative: 40 %
MCH: 34.3 pg — ABNORMAL HIGH (ref 27.0–33.0)
MCHC: 35.2 g/dL (ref 32.0–36.0)
MCV: 97.4 fL (ref 80.0–100.0)
MPV: 9.2 fL (ref 7.5–12.5)
Monocytes Absolute: 352 cells/uL (ref 200–950)
Monocytes Relative: 8 %
NEUTROS ABS: 2244 {cells}/uL (ref 1500–7800)
Neutrophils Relative %: 51 %
Platelets: 204 10*3/uL (ref 140–400)
RBC: 4.23 MIL/uL (ref 3.80–5.10)
RDW: 12.6 % (ref 11.0–15.0)
WBC: 4.4 10*3/uL (ref 4.0–10.5)

## 2017-05-20 MED ORDER — HYDROQUINONE 4 % EX CREA: TOPICAL_CREAM | CUTANEOUS | 5 refills | Status: DC

## 2017-05-21 ENCOUNTER — Encounter: Payer: Self-pay | Admitting: Family Medicine

## 2017-05-21 LAB — LIPID PANEL
Cholesterol: 238 mg/dL — ABNORMAL HIGH (ref <200)
HDL: 111 mg/dL (ref >50)
LDL Cholesterol: 112 mg/dL — ABNORMAL HIGH (ref <100)
Total CHOL/HDL Ratio: 2.1 Ratio (ref <5.0)
Triglycerides: 74 mg/dL (ref <150)
VLDL: 15 mg/dL (ref <30)

## 2017-05-21 LAB — COMPREHENSIVE METABOLIC PANEL
ALK PHOS: 86 U/L (ref 33–130)
ALT: 15 U/L (ref 6–29)
AST: 23 U/L (ref 10–35)
Albumin: 4.5 g/dL (ref 3.6–5.1)
BILIRUBIN TOTAL: 0.7 mg/dL (ref 0.2–1.2)
BUN: 12 mg/dL (ref 7–25)
CO2: 23 mmol/L (ref 20–32)
Calcium: 9.7 mg/dL (ref 8.6–10.4)
Chloride: 99 mmol/L (ref 98–110)
Creat: 0.83 mg/dL (ref 0.50–0.99)
GLUCOSE: 85 mg/dL (ref 65–99)
Potassium: 3.9 mmol/L (ref 3.5–5.3)
SODIUM: 138 mmol/L (ref 135–146)
Total Protein: 7 g/dL (ref 6.1–8.1)

## 2017-05-21 MED ORDER — DILTIAZEM HCL ER COATED BEADS 120 MG PO CP24: 120.0000 mg | ORAL_CAPSULE | Freq: Every day | ORAL | 3 refills | Status: DC

## 2017-05-21 MED ORDER — HYDROCHLOROTHIAZIDE 12.5 MG PO CAPS: ORAL_CAPSULE | ORAL | 3 refills | Status: DC

## 2017-05-21 NOTE — Progress Notes (Signed)
   Subjective:    Patient ID: Annette Murray, female    DOB: Jan 18, 1956, 61 y.o.   MRN: [id number]  HPI  She is here for a med check. She does have underlying ADHD and is doing quite well on her present medication regimen. Allows her to stay focused and she has no difficulty while on it or withdrawal type symptoms. She has had a knee replacement and is doing quite well with this. There is also family history of colon cancer and she gets routine colonoscopies. Takes HCTZ for her hypertension. Her Raynaud's is being handled with diltiazem. She states that it does usually keep this under good control. She is using a topical preparation for her age spots and is happy with that. She does have a sleep disturbance and does roll seems to have this under good control. She does take Valtrex on an as-needed basis for herpes. She does have a history of hyperlipidemia. 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 She is not sure what she is going to do after that. Psychologically she is doing quite well. Marriage is going well.  Review of Systems     Objective:   Physical Exam Alert and in no distress. Tympanic membranes and canals are normal. Pharyngeal area is normal. Neck is supple without adenopathy or thyromegaly. Cardiac exam shows a regular sinus rhythm without murmurs or gallops. Lungs are clear to auscultation.        Assessment & Plan:  ADHD (attention deficit hyperactivity disorder), predominantly hyperactive impulsive type  Need for shingles vaccine - Plan: Varicella-zoster vaccine IM (Shingrix)  Family history of colon cancer  Essential hypertension - Plan: CBC with Differential/Platelet, Comprehensive metabolic panel, hydrochlorothiazide (MICROZIDE) 12.5 MG capsule, diltiazem (CARDIZEM CD) 120 MG 24 hr capsule  Herpes labialis  Hyperlipidemia LDL goal <100 - Plan: Lipid panel  Raynaud's disease without gangrene - Plan: CBC with  Differential/Platelet, Comprehensive metabolic panel, Lipid panel, diltiazem (CARDIZEM CD) 120 MG 24 hr capsule  Mitral valve disorder - Plan: CBC with Differential/Platelet, Comprehensive metabolic panel, Lipid panel  Status post total right knee replacement  Age spots - Plan: hydroquinone 4 % cream  Encounter for long-term (current) use of medications - Plan: CBC with Differential/Platelet, Comprehensive metabolic panel, Lipid panel  Allergic rhinitis, unspecified seasonality, unspecified trigger She has Adderall and off for the next couple months and will then call for refill otherwise continue to take excellent care of herself.

## 2017-07-16 DIAGNOSIS — Z23 Encounter for immunization: Secondary | ICD-10-CM | POA: Diagnosis not present

## 2017-07-16 DIAGNOSIS — L821 Other seborrheic keratosis: Secondary | ICD-10-CM | POA: Diagnosis not present

## 2017-07-16 DIAGNOSIS — L820 Inflamed seborrheic keratosis: Secondary | ICD-10-CM | POA: Diagnosis not present

## 2017-07-25 ENCOUNTER — Telehealth: Payer: Self-pay | Admitting: Family Medicine

## 2017-07-25 DIAGNOSIS — F901 Attention-deficit hyperactivity disorder, predominantly hyperactive type: Secondary | ICD-10-CM

## 2017-07-25 MED ORDER — AMPHETAMINE-DEXTROAMPHET ER 20 MG PO CP24: 20.0000 mg | ORAL_CAPSULE | ORAL | 0 refills | Status: DC

## 2017-07-25 MED ORDER — AMPHETAMINE-DEXTROAMPHET ER 20 MG PO CP24: 20.0000 mg | ORAL_CAPSULE | Freq: Every day | ORAL | 0 refills | Status: DC

## 2017-07-25 NOTE — Telephone Encounter (Signed)
Pt called for refills of Adderall XR. Please call [phone] when ready.

## 2017-09-30 ENCOUNTER — Telehealth: Payer: Self-pay

## 2017-09-30 NOTE — Telephone Encounter (Signed)
LM on VCM that she is due for 2nd Shingrix. Victorino December

## 2017-10-15 ENCOUNTER — Other Ambulatory Visit: Payer: Self-pay | Admitting: Family Medicine

## 2017-11-03 ENCOUNTER — Other Ambulatory Visit: Payer: Self-pay | Admitting: Family Medicine

## 2017-11-03 DIAGNOSIS — G479 Sleep disorder, unspecified: Secondary | ICD-10-CM

## 2017-11-04 NOTE — Telephone Encounter (Signed)
Is this okay to refill? 

## 2017-11-07 ENCOUNTER — Other Ambulatory Visit: Payer: Self-pay | Admitting: Obstetrics and Gynecology

## 2017-11-07 DIAGNOSIS — R928 Other abnormal and inconclusive findings on diagnostic imaging of breast: Secondary | ICD-10-CM

## 2017-11-13 ENCOUNTER — Other Ambulatory Visit (INDEPENDENT_AMBULATORY_CARE_PROVIDER_SITE_OTHER): Payer: BLUE CROSS/BLUE SHIELD

## 2017-11-13 DIAGNOSIS — Z23 Encounter for immunization: Secondary | ICD-10-CM | POA: Diagnosis not present

## 2017-11-21 ENCOUNTER — Telehealth: Payer: Self-pay | Admitting: Family Medicine

## 2017-11-21 DIAGNOSIS — F901 Attention-deficit hyperactivity disorder, predominantly hyperactive type: Secondary | ICD-10-CM

## 2017-11-21 MED ORDER — AMPHETAMINE-DEXTROAMPHET ER 20 MG PO CP24: 20.0000 mg | ORAL_CAPSULE | ORAL | 0 refills | Status: DC

## 2017-11-21 MED ORDER — AMPHETAMINE-DEXTROAMPHET ER 20 MG PO CP24: 20.0000 mg | ORAL_CAPSULE | Freq: Every day | ORAL | 0 refills | Status: DC

## 2017-11-21 NOTE — Telephone Encounter (Signed)
Pt called, and is requesting a  Refill on her adderall pt uses [id number] - [place], [place] - [address] pt can be reached at [phone]

## 2017-11-26 ENCOUNTER — Other Ambulatory Visit: Payer: Self-pay | Admitting: Obstetrics and Gynecology

## 2017-11-26 DIAGNOSIS — N6489 Other specified disorders of breast: Secondary | ICD-10-CM

## 2017-11-26 DIAGNOSIS — R928 Other abnormal and inconclusive findings on diagnostic imaging of breast: Secondary | ICD-10-CM

## 2017-11-28 ENCOUNTER — Ambulatory Visit
Admission: RE | Admit: 2017-11-28 | Discharge: 2017-11-28 | Disposition: A | Discharge status: Home / Self Care | Payer: BLUE CROSS/BLUE SHIELD | Source: Ambulatory Visit | Attending: Obstetrics and Gynecology | Admitting: Obstetrics and Gynecology

## 2017-11-28 DIAGNOSIS — R928 Other abnormal and inconclusive findings on diagnostic imaging of breast: Secondary | ICD-10-CM

## 2017-11-28 DIAGNOSIS — N6489 Other specified disorders of breast: Secondary | ICD-10-CM

## 2018-01-01 DIAGNOSIS — Z800 Family history of malignant neoplasm of digestive organs: Secondary | ICD-10-CM | POA: Diagnosis not present

## 2018-01-01 DIAGNOSIS — Z803 Family history of malignant neoplasm of breast: Secondary | ICD-10-CM | POA: Diagnosis not present

## 2018-01-01 DIAGNOSIS — Z01419 Encounter for gynecological examination (general) (routine) without abnormal findings: Secondary | ICD-10-CM | POA: Diagnosis not present

## 2018-01-01 DIAGNOSIS — Z8042 Family history of malignant neoplasm of prostate: Secondary | ICD-10-CM | POA: Diagnosis not present

## 2018-01-01 DIAGNOSIS — Z6822 Body mass index (BMI) 22.0-22.9, adult: Secondary | ICD-10-CM | POA: Diagnosis not present

## 2018-01-01 DIAGNOSIS — Z86010 Personal history of colonic polyps: Secondary | ICD-10-CM | POA: Diagnosis not present

## 2018-01-10 ENCOUNTER — Other Ambulatory Visit: Payer: Self-pay | Admitting: Family Medicine

## 2018-01-10 NOTE — Telephone Encounter (Signed)
[facility] [place] [facility]. Please advise. [place]

## 2018-02-05 ENCOUNTER — Telehealth: Payer: Self-pay | Admitting: Family Medicine

## 2018-02-05 DIAGNOSIS — F901 Attention-deficit hyperactivity disorder, predominantly hyperactive type: Secondary | ICD-10-CM

## 2018-02-05 MED ORDER — AMPHETAMINE-DEXTROAMPHET ER 20 MG PO CP24: 20.0000 mg | ORAL_CAPSULE | ORAL | 0 refills | Status: DC

## 2018-02-05 MED ORDER — AMPHETAMINE-DEXTROAMPHET ER 20 MG PO CP24
20.0000 mg | ORAL_CAPSULE | ORAL | 0 refills | Status: DC
Start: 2018-04-20 — End: 2018-08-21

## 2018-02-05 MED ORDER — AMPHETAMINE-DEXTROAMPHET ER 20 MG PO CP24: 20.0000 mg | ORAL_CAPSULE | Freq: Every day | ORAL | 0 refills | Status: DC

## 2018-02-05 NOTE — Telephone Encounter (Signed)
Pt called and is requesting a refill on her adderall xr pt uses [id number] - [place], [place] - [address] and pt can be reached at [phone] informed pt that you was out of the office today

## 2018-02-06 ENCOUNTER — Other Ambulatory Visit: Payer: Self-pay | Admitting: Family Medicine

## 2018-02-06 ENCOUNTER — Telehealth: Payer: Self-pay | Admitting: Family Medicine

## 2018-02-06 DIAGNOSIS — G479 Sleep disorder, unspecified: Secondary | ICD-10-CM

## 2018-02-06 NOTE — Telephone Encounter (Signed)
[facility] trazod[facility]. Please advise KH 

## 2018-02-07 NOTE — Telephone Encounter (Signed)
Pt called & left message about date on Rx because she was out, I called pharmacy & pt had missed picking up one of her refills, she she is still ok.  Left her a message.

## 2018-02-12 DIAGNOSIS — Z809 Family history of malignant neoplasm, unspecified: Secondary | ICD-10-CM | POA: Diagnosis not present

## 2018-02-12 DIAGNOSIS — Z13820 Encounter for screening for osteoporosis: Secondary | ICD-10-CM | POA: Diagnosis not present

## 2018-02-26 DIAGNOSIS — M25562 Pain in left knee: Secondary | ICD-10-CM | POA: Diagnosis not present

## 2018-03-01 DIAGNOSIS — M25562 Pain in left knee: Secondary | ICD-10-CM | POA: Diagnosis not present

## 2018-03-05 DIAGNOSIS — M25562 Pain in left knee: Secondary | ICD-10-CM | POA: Diagnosis not present

## 2018-03-05 DIAGNOSIS — M1712 Unilateral primary osteoarthritis, left knee: Secondary | ICD-10-CM | POA: Diagnosis not present

## 2018-03-17 DIAGNOSIS — M1712 Unilateral primary osteoarthritis, left knee: Secondary | ICD-10-CM | POA: Diagnosis not present

## 2018-03-17 DIAGNOSIS — M25562 Pain in left knee: Secondary | ICD-10-CM | POA: Diagnosis not present

## 2018-03-26 DIAGNOSIS — M25562 Pain in left knee: Secondary | ICD-10-CM | POA: Diagnosis not present

## 2018-04-02 DIAGNOSIS — M25562 Pain in left knee: Secondary | ICD-10-CM | POA: Diagnosis not present

## 2018-04-05 ENCOUNTER — Other Ambulatory Visit: Payer: Self-pay | Admitting: Family Medicine

## 2018-04-07 NOTE — Telephone Encounter (Signed)
[facility] valacycl[facility]. Please advise KH 

## 2018-04-09 DIAGNOSIS — M25562 Pain in left knee: Secondary | ICD-10-CM | POA: Diagnosis not present

## 2018-04-16 DIAGNOSIS — M25562 Pain in left knee: Secondary | ICD-10-CM | POA: Diagnosis not present

## 2018-04-22 DIAGNOSIS — M1712 Unilateral primary osteoarthritis, left knee: Secondary | ICD-10-CM | POA: Diagnosis not present

## 2018-05-04 ENCOUNTER — Other Ambulatory Visit: Payer: Self-pay | Admitting: Family Medicine

## 2018-05-04 DIAGNOSIS — G479 Sleep disorder, unspecified: Secondary | ICD-10-CM

## 2018-05-05 NOTE — Telephone Encounter (Signed)
[facility] trazodone please advise [facility]

## 2018-06-11 DIAGNOSIS — M1712 Unilateral primary osteoarthritis, left knee: Secondary | ICD-10-CM | POA: Diagnosis not present

## 2018-06-12 ENCOUNTER — Telehealth: Payer: Self-pay | Admitting: Cardiovascular Disease

## 2018-06-12 ENCOUNTER — Institutional Professional Consult (permissible substitution): Payer: BLUE CROSS/BLUE SHIELD | Admitting: Family Medicine

## 2018-06-12 ENCOUNTER — Telehealth: Payer: Self-pay

## 2018-06-12 NOTE — Telephone Encounter (Signed)
   [facility] Pre-operative Risk Assessment    Request for surgical clearance:  1. What type of surgery is being performed? Left Total Knee Arthroscopy    2. When is this surgery scheduled? TBD   3. What type of clearance is required (medical clearance vs. Pharmacy clearance to hold med vs. Both)? Both  4. Are there any medications that need to be held prior to surgery and how long? Unspecified    5. Practice name and name of physician performing surgery? Dr. Lyla Glassing, [facility]    6. What is your office phone number: [phone]    7.   What is your office fax number: [phone]  8.   Anesthesia type (None, local, MAC, general) ? Unspecified    Annette Murray 06/12/2018, 11:31 AM  _________________________________________________________________   (provider comments below)

## 2018-06-12 NOTE — Telephone Encounter (Signed)
Walk in pt Form-[facility] clearance dropped off placed in Triage box to be addressed.

## 2018-06-13 NOTE — Telephone Encounter (Signed)
Called patient, LVM to call back to schedule.

## 2018-06-13 NOTE — Telephone Encounter (Signed)
   Primary Cardiologist:Peter Johnsie Cancel, MD Last seen 2012  Chart reviewed as part of pre-operative protocol coverage. Because of Jelisa [place] Doby's past medical history and time since last visit, he/she will require a follow-up visit in order to better assess preoperative cardiovascular risk.  Pre-op covering staff: - Please schedule appointment and call patient to inform them. - Please contact requesting surgeon's office via preferred method (i.e, phone, fax) to inform them of need for appointment prior to surgery.  Rosaria Ferries, PA-C  06/13/2018, 12:01 PM

## 2018-06-16 ENCOUNTER — Encounter: Payer: Self-pay | Admitting: Family Medicine

## 2018-06-16 ENCOUNTER — Ambulatory Visit (INDEPENDENT_AMBULATORY_CARE_PROVIDER_SITE_OTHER): Payer: BLUE CROSS/BLUE SHIELD | Admitting: Family Medicine

## 2018-06-16 VITALS — BP 118/80 | HR 72 | Temp 98.0°F | Wt 123.8 lb

## 2018-06-16 DIAGNOSIS — F901 Attention-deficit hyperactivity disorder, predominantly hyperactive type: Secondary | ICD-10-CM

## 2018-06-16 DIAGNOSIS — E785 Hyperlipidemia, unspecified: Secondary | ICD-10-CM | POA: Diagnosis not present

## 2018-06-16 DIAGNOSIS — G479 Sleep disorder, unspecified: Secondary | ICD-10-CM

## 2018-06-16 DIAGNOSIS — Z01818 Encounter for other preprocedural examination: Secondary | ICD-10-CM | POA: Diagnosis not present

## 2018-06-16 DIAGNOSIS — Z96651 Presence of right artificial knee joint: Secondary | ICD-10-CM

## 2018-06-16 DIAGNOSIS — I10 Essential (primary) hypertension: Secondary | ICD-10-CM | POA: Diagnosis not present

## 2018-06-16 DIAGNOSIS — I7300 Raynaud's syndrome without gangrene: Secondary | ICD-10-CM

## 2018-06-16 DIAGNOSIS — Z23 Encounter for immunization: Secondary | ICD-10-CM

## 2018-06-16 DIAGNOSIS — I059 Rheumatic mitral valve disease, unspecified: Secondary | ICD-10-CM

## 2018-06-16 DIAGNOSIS — B001 Herpesviral vesicular dermatitis: Secondary | ICD-10-CM

## 2018-06-16 LAB — CBC WITH DIFFERENTIAL/PLATELET
BASOS ABS: 0 10*3/uL (ref 0.0–0.2)
Basos: 0 %
EOS (ABSOLUTE): 0.1 10*3/uL (ref 0.0–0.4)
Eos: 1 %
Hematocrit: 46.2 % (ref 34.0–46.6)
Hemoglobin: 15.7 g/dL (ref 11.1–15.9)
IMMATURE GRANS (ABS): 0 10*3/uL (ref 0.0–0.1)
IMMATURE GRANULOCYTES: 0 %
LYMPHS: 46 %
Lymphocytes Absolute: 2.1 10*3/uL (ref 0.7–3.1)
MCH: 34.1 pg — ABNORMAL HIGH (ref 26.6–33.0)
MCHC: 34 g/dL (ref 31.5–35.7)
MCV: 100 fL — AB (ref 79–97)
Monocytes Absolute: 0.4 10*3/uL (ref 0.1–0.9)
Monocytes: 9 %
NEUTROS ABS: 2 10*3/uL (ref 1.4–7.0)
NEUTROS PCT: 44 %
PLATELETS: 258 10*3/uL (ref 150–450)
RBC: 4.6 x10E6/uL (ref 3.77–5.28)
RDW: 11.9 % — AB (ref 12.3–15.4)
WBC: 4.5 10*3/uL (ref 3.4–10.8)

## 2018-06-16 LAB — COMPREHENSIVE METABOLIC PANEL
A/G RATIO: 2.1 (ref 1.2–2.2)
ALK PHOS: 98 IU/L (ref 39–117)
ALT: 17 IU/L (ref 0–32)
AST: 21 IU/L (ref 0–40)
Albumin: 5 g/dL — ABNORMAL HIGH (ref 3.6–4.8)
BUN/Creatinine Ratio: 11 — ABNORMAL LOW (ref 12–28)
BUN: 10 mg/dL (ref 8–27)
Bilirubin Total: 0.6 mg/dL (ref 0.0–1.2)
CALCIUM: 10.2 mg/dL (ref 8.7–10.3)
CHLORIDE: 97 mmol/L (ref 96–106)
CO2: 27 mmol/L (ref 20–29)
Creatinine, Ser: 0.89 mg/dL (ref 0.57–1.00)
GFR calc Af Amer: 80 mL/min/{1.73_m2} (ref >59)
GFR, EST NON AFRICAN AMERICAN: 70 mL/min/{1.73_m2} (ref >59)
Globulin, Total: 2.4 g/dL (ref 1.5–4.5)
Glucose: 92 mg/dL (ref 65–99)
POTASSIUM: 4.9 mmol/L (ref 3.5–5.2)
Sodium: 137 mmol/L (ref 134–144)
Total Protein: 7.4 g/dL (ref 6.0–8.5)

## 2018-06-16 LAB — LIPID PANEL
CHOL/HDL RATIO: 2.8 ratio (ref 0.0–4.4)
CHOLESTEROL TOTAL: 287 mg/dL — AB (ref 100–199)
HDL: 102 mg/dL (ref >39)
LDL Calculated: 140 mg/dL — ABNORMAL HIGH (ref 0–99)
TRIGLYCERIDES: 225 mg/dL — AB (ref 0–149)
VLDL Cholesterol Cal: 45 mg/dL — ABNORMAL HIGH (ref 5–40)

## 2018-06-16 MED ORDER — HYDROCHLOROTHIAZIDE 12.5 MG PO CAPS: ORAL_CAPSULE | ORAL | 3 refills | Status: DC

## 2018-06-16 MED ORDER — DILTIAZEM HCL ER COATED BEADS 120 MG PO CP24: 120.0000 mg | ORAL_CAPSULE | Freq: Every day | ORAL | 3 refills | Status: DC

## 2018-06-16 NOTE — Telephone Encounter (Signed)
Was going through preop clearance. Pt has been scheduled to see Dr. Johnsie Cancel in November.  I left pt a message to call back to see if we can get her in sooner with a NP/PA.

## 2018-06-16 NOTE — Progress Notes (Signed)
   Subjective:    Patient ID: Annette Murray, female    DOB: 1955/12/28, 62 y.o.   MRN: [id number]  HPI She is here for preoperative evaluation.  She is getting ready to have total knee replacement.  She is already had one in is now again having a great deal of difficulty with her other knee.  She is now retired and has less need for her ADD medication.  When she does use it it usually lasts roughly 6 hours.  She had a Pap smear done as well as mammogram and DEXA.  She is not having as much trouble with her sleep as she has in the past.  She continues on diltiazem/HCTZ and occasionally will use Valtrex.  She has a previous history of mitral valve prolapse however review of the record indicates her last echocardiogram showed no evidence of prolapse.  She also has a history of Raynaud's disease but has not had any difficulty with that in quite some time.  She has not had any chest pain, shortness of breath or irregular heartbeat.   Review of Systems     Objective:   Physical Exam Alert and in no distress. Tympanic membranes and canals are normal. Pharyngeal area is normal. Neck is supple without adenopathy or thyromegaly. Cardiac exam shows a regular sinus rhythm without murmurs or gallops. Lungs are clear to auscultation.        Assessment & Plan:  Preoperative examination  Need for influenza vaccination - Plan: Flu Vaccine QUAD 6+ mos PF IM (Fluarix Quad PF)  ADHD (attention deficit hyperactivity disorder), predominantly hyperactive impulsive type  Essential hypertension - Plan: CBC with Differential/Platelet, Comprehensive metabolic panel, hydrochlorothiazide (MICROZIDE) 12.5 MG capsule, diltiazem (CARDIZEM CD) 120 MG 24 hr capsule  Hyperlipidemia LDL goal <100 - Plan: Lipid panel  Raynaud's disease without gangrene - Plan: diltiazem (CARDIZEM CD) 120 MG 24 hr capsule  Mitral valve disorder - Plan: CBC with Differential/Platelet, Comprehensive metabolic panel, Lipid panel  Status  post total right knee replacement  Sleep disturbance  Herpes labialis I will clear her for surgery.  I do not think she has anything of any major concern specifically cardiac issues.  She will use the Adderall as needed.  If she runs into trouble with her sleep disturbance, she will let me know.

## 2018-06-16 NOTE — Telephone Encounter (Signed)
Pt called back and she has been scheduled to see Pecolia Ades, 06/23/18.

## 2018-06-23 ENCOUNTER — Other Ambulatory Visit: Payer: Self-pay | Admitting: *Deleted

## 2018-06-23 ENCOUNTER — Encounter: Payer: Self-pay | Admitting: Cardiology

## 2018-06-23 ENCOUNTER — Ambulatory Visit (INDEPENDENT_AMBULATORY_CARE_PROVIDER_SITE_OTHER): Payer: BLUE CROSS/BLUE SHIELD | Admitting: Cardiology

## 2018-06-23 VITALS — BP 124/72 | HR 87 | Ht 62.0 in | Wt 125.0 lb

## 2018-06-23 DIAGNOSIS — E7800 Pure hypercholesterolemia, unspecified: Secondary | ICD-10-CM

## 2018-06-23 DIAGNOSIS — Z01810 Encounter for preprocedural cardiovascular examination: Secondary | ICD-10-CM

## 2018-06-23 DIAGNOSIS — R002 Palpitations: Secondary | ICD-10-CM | POA: Diagnosis not present

## 2018-06-23 DIAGNOSIS — I059 Rheumatic mitral valve disease, unspecified: Secondary | ICD-10-CM | POA: Diagnosis not present

## 2018-06-23 NOTE — Progress Notes (Signed)
Cardiology Office Note:    Date:  06/23/2018   ID:  Annette Murray, DOB 1955/12/20, MRN [id number]  PCP:  Denita Lung, MD  Cardiologist:  Ena Dawley, MD   Referring MD: Denita Lung, MD   Chief Complaint  Patient presents with  . Pre-op Exam    History of Present Illness:    Annette Murray is a 62 y.o. female who is being seen today for preoperative clearance prior to a left total knee arthroscopy at the request of Denita Lung, MD.   The patient has a past medical history significant for mitral valve prolapse, hypertension, vitiligo, arthritis and ADD. She may have had rheumatic fever as a child.  She began having panic attacks when her twins were born in [birth year].  She was seen in 2012 by Dr. Johnsie Cancel for follow-up of palpitations and panic attacks with mitral valve prolapse.  It was noted that an echocardiogram in 03/2010 showed a bileaflet prolapse and moderate MR.  Echo in 09/2011 did not demonstrate frank prolapse of the mitral valve.  It was documented that her palpitations were well controlled on atenolol.  She had no appreciable murmur at the time.  She is wearing a left knee brace. She injured her knee when she stepped in a mole hole in her yard. She is now planned for left knee replacement. She is very active and was exercising with walking and recumbent bike until her knee injury on 02/10/18. She has no chest pain, pressure, tightness or dyspnea with exertion or at rest. No palpitations, lightheadedness, dizziness, orthopnea, PND or syncope.   She is no longer having panic attacks since 1985 except for once when her husband was diagnosed with cancer.    No flowsheet data found.   Past Medical History:  Diagnosis Date  . ADD (attention deficit disorder)   . Allergy    RHINITIS  . Arthritis   . Complication of anesthesia    itching   . FH: colonic polyps   . Heart murmur    MVP  . Herpes labialis   . Hypertension   . MVP (mitral valve prolapse)   .  Pneumonia    hx of   . Vitiligo     Past Surgical History:  Procedure Laterality Date  . KNEE ARTHROSCOPY     right x 2   . TONSILLECTOMY    . TOTAL KNEE ARTHROPLASTY  04/28/2012   Procedure: TOTAL KNEE ARTHROPLASTY;  Surgeon: Gearlean Alf, MD;  Location: [facility];  Service: Orthopedics;  Laterality: Right;  . TUBAL LIGATION      Current Medications: Current Meds  Medication Sig  . amphetamine-dextroamphetamine (ADDERALL XR) 20 MG 24 hr capsule Take 1 capsule (20 mg total) by mouth every morning.  Marland Kitchen aspirin 81 MG tablet Take 81 mg by mouth every other day.   . diltiazem (CARDIZEM CD) 120 MG 24 hr capsule Take 1 capsule (120 mg total) by mouth daily.  . hydrochlorothiazide (MICROZIDE) 12.5 MG capsule TAKE 1 CAPSULE BY MOUTH EVERY MORNING.  . hydroquinone 4 % cream APPLY TO AFFECTED AREA TWICE DAILY  . traZODone (DESYREL) 50 MG tablet TAKE 1 TABLET BY MOUTH AT BEDTIME  . valACYclovir (VALTREX) 500 MG tablet TAKE 1 TABLET (500 MG TOTAL) BY MOUTH EVERY MORNING.     Allergies:   Codeine   Social History   Socioeconomic History  . Marital status: Married    Spouse name: Not on file  . Number of children:  Not on file  . Years of education: Not on file  . Highest education level: Not on file  Occupational History  . Not on file  Social Needs  . Financial resource strain: Not on file  . Food insecurity:    Worry: Not on file    Inability: Not on file  . Transportation needs:    Medical: Not on file    Non-medical: Not on file  Tobacco Use  . Smoking status: Never Smoker  . Smokeless tobacco: Never Used  Substance and Sexual Activity  . Alcohol use: Yes    Alcohol/week: 2.0 standard drinks    Types: 1 Cans of beer, 1 Shots of liquor per week  . Drug use: No  . Sexual activity: Yes  Lifestyle  . Physical activity:    Days per week: Not on file    Minutes per session: Not on file  . Stress: Not on file  Relationships  . Social connections:    Talks on phone: Not on  file    Gets together: Not on file    Attends religious service: Not on file    Active member of club or organization: Not on file    Attends meetings of clubs or organizations: Not on file    Relationship status: Not on file  Other Topics Concern  . Not on file  Social History Narrative  . Not on file     Family History: The patient's family history includes Arthritis in her mother; Asthma in her mother; Breast cancer in her maternal aunt. ROS:   Please see the history of present illness.     All other systems reviewed and are negative.  EKGs/Labs/Other Studies Reviewed:    The following studies were reviewed today:  Echocardiogram 09/21/2011 Study Conclusions - Left ventricle: The cavity size was normal. Wall thickness was normal. Systolic function was normal. The estimated ejection fraction was in the range of 55% to 60%. - Mitral valve: No frank prolapse seen - Atrial septum: No defect or patent foramen ovale was identified.  EKG:  EKG is  ordered today.  The ekg ordered today demonstrates NSR at 87 bpm.   Recent Labs: 06/16/2018: ALT 17; BUN 10; Creatinine, Ser 0.89; Hemoglobin 15.7; Platelets 258; Potassium 4.9; Sodium 137   Recent Lipid Panel    Component Value Date/Time   CHOL 287 (H) 06/16/2018 1101   TRIG 225 (H) 06/16/2018 1101   HDL 102 06/16/2018 1101   CHOLHDL 2.8 06/16/2018 1101   CHOLHDL 2.1 05/20/2017 1435   VLDL 15 05/20/2017 1435   LDLCALC 140 (H) 06/16/2018 1101    Physical Exam:    VS:  BP 124/72   Pulse 87   Ht 5\' 2"  (1.575 m)   Wt 125 lb (56.7 kg)   BMI 22.86 kg/m     Wt Readings from Last 3 Encounters:  06/23/18 125 lb (56.7 kg)  06/16/18 123 lb 12.8 oz (56.2 kg)  05/20/17 117 lb 12.8 oz (53.4 kg)     GEN:  Well nourished, well developed in no acute distress HEENT: Normal NECK: No JVD; No carotid bruits LYMPHATICS: No lymphadenopathy CARDIAC: RRR, no murmurs, rubs, gallops RESPIRATORY:  Clear to auscultation without  rales, wheezing or rhonchi  ABDOMEN: Soft, non-tender, non-distended MUSCULOSKELETAL:  No edema; No deformity  SKIN: Warm and dry NEUROLOGIC:  Alert and oriented x 3 PSYCHIATRIC:  Normal affect   ASSESSMENT:    1. Preop cardiovascular exam   2. Mitral valve disorder  3. Elevated cholesterol    PLAN:    This patient's case was discussed in depth with Dr. Meda Coffee. The plan below was formulated per our discussion.  In order of problems listed above:  Preoperatively clearance: Annette Murray has no CAD, stroke or HF history. Her renal function is normal and she is not diabetic. She is able to complete >4 Mets of activity easily. She is at low risk of major cardiac event peri-operatively.   Mitral valve prolapse? -Bileaflet valve noted on echo in 2011, however echocardiogram in 2012 showed no frank prolapse. -No murmur noted -Will update echocardiogram prior to her planned knee replacement  Palpitations: Did not tolerated BB due to raynaud's syndrome. Now on diltiazem and no palpitations for many years.   Hyperlipidemia: LDL 140 on 06/16/18 (has been <120). Advise to shoot for <100. She has been unable to exercise due to knee injury and may be reason why it is elevated. Recommend heart healthy diet and exercise once she recovers from her knee surgery. We discussed doing a CT calcium scoring for risk stratification and she is interested. She understands that the cost is not covered by insurance and would pay out of pocket.  I will place the order and she will think about it and probably schedule it later after recovery from her knee surgery.   Pre-op clearance: Annette Murray has no prior known CAD, CHF, stroke, renal failure, diabetes. She is active, able to complete > 4 Mets of activity, only limited by her knee injury. She is at low risk for cardiac complications with knee surgery. We will check an echo and if no significant abnormalities she can proceed with knee replacement without any further  cardiac workup.    Medication Adjustments/Labs and Tests Ordered: Current medicines are reviewed at length with the patient today.  Concerns regarding medicines are outlined above. Labs and tests ordered and medication changes are outlined in the patient instructions below:  Patient Instructions  Medication Instructions: Your physician recommends that you continue on your current medications as directed. Please refer to the Current Medication list given to you today.   Labwork: None  Procedures/Testing: Your physician has requested that you have an echocardiogram. Echocardiography is a painless test that uses sound waves to create images of your heart. It provides your doctor with information about the size and shape of your heart and how well your heart's chambers and valves are working. This procedure takes approximately one hour. There are no restrictions for this procedure.    Follow-Up: Your physician wants you to follow-up in: 1 year with Dr. Johann Capers will receive a reminder letter in the mail two months in advance. If you don't receive a letter, please call our office to schedule the follow-up appointment.   Any Additional Special Instructions Will Be Listed Below (If Applicable).    Coronary Calcium Scan A coronary calcium scan is an imaging test used to look for deposits of calcium and other fatty materials (plaques) in the inner lining of the blood vessels of the heart (coronary arteries). These deposits of calcium and plaques can partly clog and narrow the coronary arteries without producing any symptoms or warning signs. This puts a person at risk for a heart attack. This test can detect these deposits before symptoms develop. Tell a health care provider about:  Any allergies you have.  All medicines you are taking, including vitamins, herbs, eye drops, creams, and over-the-counter medicines.  Any problems you or family members have had with anesthetic  medicines.  Any  blood disorders you have.  Any surgeries you have had.  Any medical conditions you have.  Whether you are pregnant or may be pregnant. What are the risks? Generally, this is a safe procedure. However, problems may occur, including:  Harm to a pregnant woman and her unborn baby. This test involves the use of radiation. Radiation exposure can be dangerous to a pregnant woman and her unborn baby. If you are pregnant, you generally should not have this procedure done.  Slight increase in the risk of cancer. This is because of the radiation involved in the test.  What happens before the procedure? No preparation is needed for this procedure. What happens during the procedure?  You will undress and remove any jewelry around your neck or chest.  You will put on a hospital gown.  Sticky electrodes will be placed on your chest. The electrodes will be connected to an electrocardiogram (ECG) machine to record a tracing of the electrical activity of your heart.  A CT scanner will take pictures of your heart. During this time, you will be asked to lie still and hold your breath for 2-3 seconds while a picture of your heart is being taken. The procedure may vary among health care providers and hospitals. What happens after the procedure?  You can get dressed.  You can return to your normal activities.  It is up to you to get the results of your test. Ask your health care provider, or the department that is doing the test, when your results will be ready. Summary  A coronary calcium scan is an imaging test used to look for deposits of calcium and other fatty materials (plaques) in the inner lining of the blood vessels of the heart (coronary arteries).  Generally, this is a safe procedure. Tell your health care provider if you are pregnant or may be pregnant.  No preparation is needed for this procedure.  A CT scanner will take pictures of your heart.  You can return to your normal  activities after the scan is done. This information is not intended to replace advice given to you by your health care provider. Make sure you discuss any questions you have with your health care provider. Document Released: 03/15/2008 Document Revised: 08/06/2016 Document Reviewed: 08/06/2016 Elsevier Interactive Patient Education  2017 [employer].   If you need a refill on your cardiac medications before your next appointment, please call your pharmacy.      Signed, Daune Perch, NP  06/23/2018 1:49 PM    [facility]

## 2018-06-23 NOTE — Patient Instructions (Addendum)
Medication Instructions: Your physician recommends that you continue on your current medications as directed. Please refer to the Current Medication list given to you today.   Labwork: None  Procedures/Testing: Your physician has requested that you have an echocardiogram. Echocardiography is a painless test that uses sound waves to create images of your heart. It provides your doctor with information about the size and shape of your heart and how well your heart's chambers and valves are working. This procedure takes approximately one hour. There are no restrictions for this procedure.    Follow-Up: Your physician wants you to follow-up in: 1 year with Dr. Johann Capers will receive a reminder letter in the mail two months in advance. If you don't receive a letter, please call our office to schedule the follow-up appointment.   Any Additional Special Instructions Will Be Listed Below (If Applicable).    Coronary Calcium Scan A coronary calcium scan is an imaging test used to look for deposits of calcium and other fatty materials (plaques) in the inner lining of the blood vessels of the heart (coronary arteries). These deposits of calcium and plaques can partly clog and narrow the coronary arteries without producing any symptoms or warning signs. This puts a person at risk for a heart attack. This test can detect these deposits before symptoms develop. Tell a health care provider about:  Any allergies you have.  All medicines you are taking, including vitamins, herbs, eye drops, creams, and over-the-counter medicines.  Any problems you or family members have had with anesthetic medicines.  Any blood disorders you have.  Any surgeries you have had.  Any medical conditions you have.  Whether you are pregnant or may be pregnant. What are the risks? Generally, this is a safe procedure. However, problems may occur, including:  Harm to a pregnant woman and her unborn baby. This test  involves the use of radiation. Radiation exposure can be dangerous to a pregnant woman and her unborn baby. If you are pregnant, you generally should not have this procedure done.  Slight increase in the risk of cancer. This is because of the radiation involved in the test.  What happens before the procedure? No preparation is needed for this procedure. What happens during the procedure?  You will undress and remove any jewelry around your neck or chest.  You will put on a hospital gown.  Sticky electrodes will be placed on your chest. The electrodes will be connected to an electrocardiogram (ECG) machine to record a tracing of the electrical activity of your heart.  A CT scanner will take pictures of your heart. During this time, you will be asked to lie still and hold your breath for 2-3 seconds while a picture of your heart is being taken. The procedure may vary among health care providers and hospitals. What happens after the procedure?  You can get dressed.  You can return to your normal activities.  It is up to you to get the results of your test. Ask your health care provider, or the department that is doing the test, when your results will be ready. Summary  A coronary calcium scan is an imaging test used to look for deposits of calcium and other fatty materials (plaques) in the inner lining of the blood vessels of the heart (coronary arteries).  Generally, this is a safe procedure. Tell your health care provider if you are pregnant or may be pregnant.  No preparation is needed for this procedure.  A CT scanner  will take pictures of your heart.  You can return to your normal activities after the scan is done. This information is not intended to replace advice given to you by your health care provider. Make sure you discuss any questions you have with your health care provider. Document Released: 03/15/2008 Document Revised: 08/06/2016 Document Reviewed: 08/06/2016 Elsevier  Interactive Patient Education  2017 [employer].   If you need a refill on your cardiac medications before your next appointment, please call your pharmacy.

## 2018-06-24 ENCOUNTER — Ambulatory Visit (HOSPITAL_COMMUNITY): Payer: BLUE CROSS/BLUE SHIELD | Attending: Cardiology

## 2018-06-24 ENCOUNTER — Other Ambulatory Visit: Payer: Self-pay

## 2018-06-24 DIAGNOSIS — I081 Rheumatic disorders of both mitral and tricuspid valves: Secondary | ICD-10-CM | POA: Diagnosis not present

## 2018-06-24 DIAGNOSIS — I10 Essential (primary) hypertension: Secondary | ICD-10-CM | POA: Diagnosis not present

## 2018-06-24 DIAGNOSIS — I059 Rheumatic mitral valve disease, unspecified: Secondary | ICD-10-CM | POA: Diagnosis not present

## 2018-06-24 DIAGNOSIS — E785 Hyperlipidemia, unspecified: Secondary | ICD-10-CM | POA: Diagnosis not present

## 2018-06-24 NOTE — Telephone Encounter (Signed)
   Primary Cardiologist: Ena Dawley, MD  Chart reviewed as part of pre-operative protocol coverage. Patient was contacted 06/24/2018 in reference to pre-operative risk assessment for pending surgery as outlined below.  Annette Murray was last seen on 06/23/18 by Daune Perch, NP.  Her only cardiac history consists of mitral valve regurgitation and palpitations well controlled on BB. Echo done today indicates stability and she is Low risk for surgery.   Therefore, based on ACC/AHA guidelines, the patient would be at acceptable risk for the planned procedure without further cardiovascular testing.   I will route this recommendation to the requesting party via Epic fax function and remove from pre-op pool.  Please call with questions.  Daune Perch, NP 06/24/2018, 6:11 PM

## 2018-07-01 ENCOUNTER — Encounter (HOSPITAL_COMMUNITY): Payer: Self-pay

## 2018-07-01 ENCOUNTER — Ambulatory Visit: Payer: Self-pay | Admitting: Orthopedic Surgery

## 2018-07-01 NOTE — Progress Notes (Signed)
CLEARANCE DR. LALANDE 06-16-18 CHART. cmp and cbc 06-16-18 on chart  ekg 06-23-18 epic  Echo 06-24-18 epic   cardiac clearance 06-23-18 epic Daune Perch ,NP epic

## 2018-07-01 NOTE — Patient Instructions (Signed)
CATHELINE HIXON  07/01/2018   Your procedure is scheduled on: 07-10-18  Report to [facility] Main  Entrance  Report to admitting at    1000 AM    Call this number if you have problems the morning of surgery [phone]   Remember: Do not eat food or drink liquids :After Midnight. BRUSH YOUR TEETH MORNING OF SURGERY AND RINSE YOUR MOUTH OUT, NO CHEWING GUM CANDY OR MINTS.     Take these medicines the morning of surgery with A SIP OF WATER: VALTREX, DILTIAZEM                                You may not have any metal on your body including hair pins and              piercings  Do not wear jewelry, , lotions, powders or perfumes, deodorant              Do not bring valuables to the hospital. [place].  Contacts, dentures or bridgework may not be worn into surgery.  Leave suitcase in the car. After surgery it may be brought to your room.                  Please read over the following fact sheets you were given: _____________________________________________________________________             [facility] - Preparing for Surgery Before surgery, you can play an important role.  Because skin is not sterile, your skin needs to be as free of germs as possible.  You can reduce the number of germs on your skin by washing with CHG (chlorahexidine gluconate) soap before surgery.  CHG is an antiseptic cleaner which kills germs and bonds with the skin to continue killing germs even after washing. Please DO NOT use if you have an allergy to CHG or antibacterial soaps.  If your skin becomes reddened/irritated stop using the CHG and inform your nurse when you arrive at Short Stay. Do not shave (including legs and underarms) for at least 48 hours prior to the first CHG shower.  You may shave your face/neck. Please follow these instructions carefully:  1.  Shower with CHG Soap the night before surgery and the  morning of  Surgery.  2.  If you choose to wash your hair, wash your hair first as usual with your  normal  shampoo.  3.  After you shampoo, rinse your hair and body thoroughly to remove the  shampoo.                           4.  Use CHG as you would any other liquid soap.  You can apply chg directly  to the skin and wash                       Gently with a scrungie or clean washcloth.  5.  Apply the CHG Soap to your body ONLY FROM THE NECK DOWN.   Do not use on face/ open  Wound or open sores. Avoid contact with eyes, ears mouth and genitals (private parts).                       Wash face,  Genitals (private parts) with your normal soap.             6.  Wash thoroughly, paying special attention to the area where your surgery  will be performed.  7.  Thoroughly rinse your body with warm water from the neck down.  8.  DO NOT shower/wash with your normal soap after using and rinsing off  the CHG Soap.                9.  Pat yourself dry with a clean towel.            10.  Wear clean pajamas.            11.  Place clean sheets on your bed the night of your first shower and do not  sleep with pets. Day of Surgery : Do not apply any lotions/deodorants the morning of surgery.  Please wear clean clothes to the hospital/surgery center.  FAILURE TO FOLLOW THESE INSTRUCTIONS MAY RESULT IN THE CANCELLATION OF YOUR SURGERY PATIENT SIGNATURE_________________________________  NURSE SIGNATURE__________________________________  ________________________________________________________________________  WHAT IS A BLOOD TRANSFUSION? Blood Transfusion Information  A transfusion is the replacement of blood or some of its parts. Blood is made up of multiple cells which provide different functions.  Red blood cells carry oxygen and are used for blood loss replacement.  White blood cells fight against infection.  Platelets control bleeding.  Plasma helps clot blood.  Other blood products are  available for specialized needs, such as hemophilia or other clotting disorders. BEFORE THE TRANSFUSION  Who gives blood for transfusions?   Healthy volunteers who are fully evaluated to make sure their blood is safe. This is blood bank blood. Transfusion therapy is the safest it has ever been in the practice of medicine. Before blood is taken from a donor, a complete history is taken to make sure that person has no history of diseases nor engages in risky social behavior (examples are intravenous drug use or sexual activity with multiple partners). The donor's travel history is screened to minimize risk of transmitting infections, such as malaria. The donated blood is tested for signs of infectious diseases, such as HIV and hepatitis. The blood is then tested to be sure it is compatible with you in order to minimize the chance of a transfusion reaction. If you or a relative donates blood, this is often done in anticipation of surgery and is not appropriate for emergency situations. It takes many days to process the donated blood. RISKS AND COMPLICATIONS Although transfusion therapy is very safe and saves many lives, the main dangers of transfusion include:   Getting an infectious disease.  Developing a transfusion reaction. This is an allergic reaction to something in the blood you were given. Every precaution is taken to prevent this. The decision to have a blood transfusion has been considered carefully by your caregiver before blood is given. Blood is not given unless the benefits outweigh the risks. AFTER THE TRANSFUSION  Right after receiving a blood transfusion, you will usually feel much better and more energetic. This is especially true if your red blood cells have gotten low (anemic). The transfusion raises the level of the red blood cells which carry oxygen, and this usually causes an energy increase.  The  nurse administering the transfusion will monitor you carefully for  complications. HOME CARE INSTRUCTIONS  No special instructions are needed after a transfusion. You may find your energy is better. Speak with your caregiver about any limitations on activity for underlying diseases you may have. SEEK MEDICAL CARE IF:   Your condition is not improving after your transfusion.  You develop redness or irritation at the intravenous (IV) site. SEEK IMMEDIATE MEDICAL CARE IF:  Any of the following symptoms occur over the next 12 hours:  Shaking chills.  You have a temperature by mouth above 102 F (38.9 C), not controlled by medicine.  Chest, back, or muscle pain.  People around you feel you are not acting correctly or are confused.  Shortness of breath or difficulty breathing.  Dizziness and fainting.  You get a rash or develop hives.  You have a decrease in urine output.  Your urine turns a dark color or changes to pink, red, or brown. Any of the following symptoms occur over the next 10 days:  You have a temperature by mouth above 102 F (38.9 C), not controlled by medicine.  Shortness of breath.  Weakness after normal activity.  The white part of the eye turns yellow (jaundice).  You have a decrease in the amount of urine or are urinating less often.  Your urine turns a dark color or changes to pink, red, or brown. Document Released: 09/14/2000 Document Revised: 12/10/2011 Document Reviewed: 05/03/2008 ExitCare Patient Information 2014 [place].  _______________________________________________________________________  Incentive Spirometer  An incentive spirometer is a tool that can help keep your lungs clear and active. This tool measures how well you are filling your lungs with each breath. Taking long deep breaths may help reverse or decrease the chance of developing breathing (pulmonary) problems (especially infection) following:  A long period of time when you are unable to move or be active. BEFORE THE PROCEDURE   If  the spirometer includes an indicator to show your best effort, your nurse or respiratory therapist will set it to a desired goal.  If possible, sit up straight or lean slightly forward. Try not to slouch.  Hold the incentive spirometer in an upright position. INSTRUCTIONS FOR USE  1. Sit on the edge of your bed if possible, or sit up as far as you can in bed or on a chair. 2. Hold the incentive spirometer in an upright position. 3. Breathe out normally. 4. Place the mouthpiece in your mouth and seal your lips tightly around it. 5. Breathe in slowly and as deeply as possible, raising the piston or the ball toward the top of the column. 6. Hold your breath for 3-5 seconds or for as long as possible. Allow the piston or ball to fall to the bottom of the column. 7. Remove the mouthpiece from your mouth and breathe out normally. 8. Rest for a few seconds and repeat Steps 1 through 7 at least 10 times every 1-2 hours when you are awake. Take your time and take a few normal breaths between deep breaths. 9. The spirometer may include an indicator to show your best effort. Use the indicator as a goal to work toward during each repetition. 10. After each set of 10 deep breaths, practice coughing to be sure your lungs are clear. If you have an incision (the cut made at the time of surgery), support your incision when coughing by placing a pillow or rolled up towels firmly against it. Once you are able to get  out of bed, walk around indoors and cough well. You may stop using the incentive spirometer when instructed by your caregiver.  RISKS AND COMPLICATIONS  Take your time so you do not get dizzy or light-headed.  If you are in pain, you may need to take or ask for pain medication before doing incentive spirometry. It is harder to take a deep breath if you are having pain. AFTER USE  Rest and breathe slowly and easily.  It can be helpful to keep track of a log of your progress. Your caregiver can  provide you with a simple table to help with this. If you are using the spirometer at home, follow these instructions: [place] IF:   You are having difficultly using the spirometer.  You have trouble using the spirometer as often as instructed.  Your pain medication is not giving enough relief while using the spirometer.  You develop fever of 100.5 F (38.1 C) or higher. SEEK IMMEDIATE MEDICAL CARE IF:   You cough up bloody sputum that had not been present before.  You develop fever of 102 F (38.9 C) or greater.  You develop worsening pain at or near the incision site. MAKE SURE YOU:   Understand these instructions.  Will watch your condition.  Will get help right away if you are not doing well or get worse. Document Released: 01/28/2007 Document Revised: 12/10/2011 Document Reviewed: 03/31/2007 [facility] Patient Information 2014 [place], [place].   ________________________________________________________________________

## 2018-07-02 ENCOUNTER — Other Ambulatory Visit: Payer: Self-pay

## 2018-07-02 ENCOUNTER — Encounter (HOSPITAL_COMMUNITY)
Admission: RE | Admit: 2018-07-02 | Discharge: 2018-07-02 | Disposition: A | Discharge status: Home / Self Care | Payer: BLUE CROSS/BLUE SHIELD | Source: Ambulatory Visit | Attending: Orthopedic Surgery | Admitting: Orthopedic Surgery

## 2018-07-02 ENCOUNTER — Encounter (HOSPITAL_COMMUNITY): Payer: Self-pay

## 2018-07-02 DIAGNOSIS — Z01818 Encounter for other preprocedural examination: Secondary | ICD-10-CM | POA: Diagnosis not present

## 2018-07-02 DIAGNOSIS — M1712 Unilateral primary osteoarthritis, left knee: Secondary | ICD-10-CM | POA: Diagnosis not present

## 2018-07-02 LAB — SURGICAL PCR SCREEN
MRSA, PCR: NEGATIVE
Staphylococcus aureus: NEGATIVE

## 2018-07-04 ENCOUNTER — Ambulatory Visit: Payer: Self-pay | Admitting: Orthopedic Surgery

## 2018-07-04 NOTE — H&P (Signed)
TOTAL KNEE ADMISSION H&P  Patient is being admitted for left total knee arthroplasty.  Subjective:  Chief Complaint:left knee pain.  HPI: Annette Murray, 62 y.o. female, has a history of pain and functional disability in the left knee due to arthritis and has failed non-surgical conservative treatments for greater than 12 weeks to includeNSAID's and/or analgesics, corticosteriod injections, flexibility and strengthening excercises, use of assistive devices, weight reduction as appropriate and activity modification.  Onset of symptoms was gradual, starting 4 years ago with gradually worsening course since that time. The patient noted no past surgery on the left knee(s).  Patient currently rates pain in the left knee(s) at 10 out of 10 with activity. Patient has night pain, worsening of pain with activity and weight bearing, pain that interferes with activities of daily living, pain with passive range of motion, crepitus and joint swelling.  Patient has evidence of subchondral cysts, subchondral sclerosis, periarticular osteophytes and joint space narrowing by imaging studies. There is no active infection.  Patient Active Problem List   Diagnosis Date Noted  . Family history of colon cancer 10/18/2015  . Hyperlipidemia LDL goal <100 10/18/2015  . Age spots 09/20/2014  . Essential hypertension 09/20/2014  . Sleep disturbance 09/20/2014  . Raynaud's disease 09/20/2014  . S/P TKR (total knee replacement) 04/28/2012  . Herpes labialis 05/30/2011  . ADHD (attention deficit hyperactivity disorder), predominantly hyperactive impulsive type 05/30/2011  . Mitral valve disorder 04/13/2010   Past Medical History:  Diagnosis Date  . ADD (attention deficit disorder)   . Allergy    RHINITIS  . Arthritis   . Complication of anesthesia    itching   . FH: colonic polyps   . Heart murmur    MVP  . Herpes labialis   . Hypertension    borderline  . MVP (mitral valve prolapse)   . Pneumonia    hx  of   . Vitiligo     Past Surgical History:  Procedure Laterality Date  . KNEE ARTHROSCOPY     right x 2   right knee re built 15 years ago  . TONSILLECTOMY    . TOTAL KNEE ARTHROPLASTY  04/28/2012   Procedure: TOTAL KNEE ARTHROPLASTY;  Surgeon: Gearlean Alf, MD;  Location: [facility] ORS;  Service: Orthopedics;  Laterality: Right;  . TUBAL LIGATION      Current Outpatient Medications  Medication Sig Dispense Refill Last Dose  . acetaminophen (TYLENOL) 500 MG tablet Take 1,000 mg by mouth 2 (two) times daily as needed for moderate pain or headache.     Marland Kitchen amoxicillin (AMOXIL) 500 MG capsule Take 2,000 mg by mouth See admin instructions. 1 hour prior to dental visit   3 Not Taking  . amphetamine-dextroamphetamine (ADDERALL XR) 20 MG 24 hr capsule Take 1 capsule (20 mg total) by mouth every morning. (Patient taking differently: Take 20 mg by mouth daily as needed (attention). ) 30 capsule 0 Taking  . aspirin 81 MG tablet Take 81 mg by mouth every other day.    Taking  . Cyanocobalamin (VITAMIN B-12 PO) Take 1 capsule by mouth daily.     Marland Kitchen diltiazem (CARDIZEM CD) 120 MG 24 hr capsule Take 1 capsule (120 mg total) by mouth daily. 90 capsule 3 Taking  . fluticasone (FLONASE) 50 MCG/ACT nasal spray Place 1 spray into both nostrils at bedtime as needed for allergies or rhinitis.     . hydrochlorothiazide (MICROZIDE) 12.5 MG capsule TAKE 1 CAPSULE BY MOUTH EVERY MORNING. (Patient taking  differently: Take 12.5 mg by mouth daily. ) 90 capsule 3 Taking  . hydroquinone 4 % cream APPLY TO AFFECTED AREA TWICE DAILY (Patient taking differently: Apply 1 application topically at bedtime. ) 28.35 g 5 Taking  . Multiple Vitamin (MULTIVITAMIN WITH MINERALS) TABS tablet Take 1 tablet by mouth daily.     . Potassium 99 MG TABS Take 99 mg by mouth daily.     . psyllium (REGULOID) 0.52 g capsule Take 0.52 g by mouth daily as needed (constipation).     . traZODone (DESYREL) 50 MG tablet TAKE 1 TABLET BY MOUTH AT BEDTIME  (Patient taking differently: Take 50 mg by mouth at bedtime as needed for sleep. ) 90 tablet 0 Taking  . valACYclovir (VALTREX) 500 MG tablet TAKE 1 TABLET (500 MG TOTAL) BY MOUTH EVERY MORNING. (Patient taking differently: Take 500 mg by mouth daily. ) 90 tablet 0 Taking   No current facility-administered medications for this visit.    Allergies  Allergen Reactions  . Codeine Itching    Tolerates with benadryl     Social History   Tobacco Use  . Smoking status: Never Smoker  . Smokeless tobacco: Never Used  Substance Use Topics  . Alcohol use: Yes    Alcohol/week: 2.0 standard drinks    Types: 1 Cans of beer, 1 Shots of liquor per week    Comment: 2- drimks daily    Family History  Problem Relation Age of Onset  . Arthritis Mother   . Asthma Mother   . Breast cancer Maternal Aunt      Review of Systems  Constitutional: Negative.   HENT: Negative.   Eyes: Negative.   Respiratory: Negative.   Cardiovascular: Negative.   Gastrointestinal: Negative.   Genitourinary: Negative.   Musculoskeletal: Positive for joint pain.  Skin: Negative.   Neurological: Negative.   Endo/Heme/Allergies: Negative.   Psychiatric/Behavioral: Negative.     Objective:  Physical Exam  Vitals reviewed. Constitutional: She is oriented to person, place, and time. She appears well-developed and well-nourished.  HENT:  Head: Normocephalic and atraumatic.  Eyes: Pupils are equal, round, and reactive to light. Conjunctivae and EOM are normal.  Neck: Normal range of motion. Neck supple.  Cardiovascular: Normal rate, regular rhythm and intact distal pulses.  Respiratory: Effort normal. No respiratory distress.  GI: Soft. She exhibits no distension.  Genitourinary:  Genitourinary Comments: deferred  Musculoskeletal:       Left knee: She exhibits decreased range of motion, swelling, effusion and abnormal alignment. Tenderness found. Medial joint line and lateral joint line tenderness noted.   Neurological: She is alert and oriented to person, place, and time. She has normal reflexes.  Skin: Skin is warm and dry.  Psychiatric: She has a normal mood and affect. Her behavior is normal. Judgment and thought content normal.    Vital signs in last 24 hours: @VSRANGES @  Labs:   Estimated body mass index is 23.24 kg/m as calculated from the following:   Height as of 07/02/18: 5\' 1"  (1.549 m).   Weight as of 07/02/18: 55.8 kg.   Imaging Review Plain radiographs demonstrate severe degenerative joint disease of the left knee(s). The overall alignment issignificant varus. The bone quality appears to be adequate for age and reported activity level.   Preoperative templating of the joint replacement has been completed, documented, and submitted to the Operating Room personnel in order to optimize intra-operative equipment management.    Patient's anticipated LOS is less than 2 midnights, meeting these requirements: -  Younger than 44 - Lives within 1 hour of care - Has a competent adult at home to recover with post-op recover - NO history of  - Chronic pain requiring opiods  - Diabetes  - Coronary Artery Disease  - Heart failure  - Heart attack  - Stroke  - DVT/VTE  - Cardiac arrhythmia  - Respiratory Failure/COPD  - Renal failure  - Anemia  - Advanced Liver disease        Assessment/Plan:  End stage arthritis, left knee   The patient history, physical examination, clinical judgment of the provider and imaging studies are consistent with end stage degenerative joint disease of the left knee(s) and total knee arthroplasty is deemed medically necessary. The treatment options including medical management, injection therapy arthroscopy and arthroplasty were discussed at length. The risks and benefits of total knee arthroplasty were presented and reviewed. The risks due to aseptic loosening, infection, stiffness, patella tracking problems, thromboembolic complications and  other imponderables were discussed. The patient acknowledged the explanation, agreed to proceed with the plan and consent was signed. Patient is being admitted for inpatient treatment for surgery, pain control, PT, OT, prophylactic antibiotics, VTE prophylaxis, progressive ambulation and ADL's and discharge planning. The patient is planning to be discharged home with outpatient PT. Has DME.

## 2018-07-04 NOTE — H&P (View-Only) (Signed)
TOTAL KNEE ADMISSION H&P  Patient is being admitted for left total knee arthroplasty.  Subjective:  Chief Complaint:left knee pain.  HPI: Annette Murray, 62 y.o. female, has a history of pain and functional disability in the left knee due to arthritis and has failed non-surgical conservative treatments for greater than 12 weeks to includeNSAID's and/or analgesics, corticosteriod injections, flexibility and strengthening excercises, use of assistive devices, weight reduction as appropriate and activity modification.  Onset of symptoms was gradual, starting 4 years ago with gradually worsening course since that time. The patient noted no past surgery on the left knee(s).  Patient currently rates pain in the left knee(s) at 10 out of 10 with activity. Patient has night pain, worsening of pain with activity and weight bearing, pain that interferes with activities of daily living, pain with passive range of motion, crepitus and joint swelling.  Patient has evidence of subchondral cysts, subchondral sclerosis, periarticular osteophytes and joint space narrowing by imaging studies. There is no active infection.  Patient Active Problem List   Diagnosis Date Noted  . Family history of colon cancer 10/18/2015  . Hyperlipidemia LDL goal <100 10/18/2015  . Age spots 09/20/2014  . Essential hypertension 09/20/2014  . Sleep disturbance 09/20/2014  . Raynaud's disease 09/20/2014  . S/P TKR (total knee replacement) 04/28/2012  . Herpes labialis 05/30/2011  . ADHD (attention deficit hyperactivity disorder), predominantly hyperactive impulsive type 05/30/2011  . Mitral valve disorder 04/13/2010   Past Medical History:  Diagnosis Date  . ADD (attention deficit disorder)   . Allergy    RHINITIS  . Arthritis   . Complication of anesthesia    itching   . FH: colonic polyps   . Heart murmur    MVP  . Herpes labialis   . Hypertension    borderline  . MVP (mitral valve prolapse)   . Pneumonia    hx  of   . Vitiligo     Past Surgical History:  Procedure Laterality Date  . KNEE ARTHROSCOPY     right x 2   right knee re built 15 years ago  . TONSILLECTOMY    . TOTAL KNEE ARTHROPLASTY  04/28/2012   Procedure: TOTAL KNEE ARTHROPLASTY;  Surgeon: Gearlean Alf, MD;  Location: [facility];  Service: Orthopedics;  Laterality: Right;  . TUBAL LIGATION      Current Outpatient Medications  Medication Sig Dispense Refill Last Dose  . acetaminophen (TYLENOL) 500 MG tablet Take 1,000 mg by mouth 2 (two) times daily as needed for moderate pain or headache.     Marland Kitchen amoxicillin (AMOXIL) 500 MG capsule Take 2,000 mg by mouth See admin instructions. 1 hour prior to dental visit   3 Not Taking  . amphetamine-dextroamphetamine (ADDERALL XR) 20 MG 24 hr capsule Take 1 capsule (20 mg total) by mouth every morning. (Patient taking differently: Take 20 mg by mouth daily as needed (attention). ) 30 capsule 0 Taking  . aspirin 81 MG tablet Take 81 mg by mouth every other day.    Taking  . Cyanocobalamin (VITAMIN B-12 PO) Take 1 capsule by mouth daily.     Marland Kitchen diltiazem (CARDIZEM CD) 120 MG 24 hr capsule Take 1 capsule (120 mg total) by mouth daily. 90 capsule 3 Taking  . fluticasone (FLONASE) 50 MCG/ACT nasal spray Place 1 spray into both nostrils at bedtime as needed for allergies or rhinitis.     . hydrochlorothiazide (MICROZIDE) 12.5 MG capsule TAKE 1 CAPSULE BY MOUTH EVERY MORNING. (Patient taking  differently: Take 12.5 mg by mouth daily. ) 90 capsule 3 Taking  . hydroquinone 4 % cream APPLY TO AFFECTED AREA TWICE DAILY (Patient taking differently: Apply 1 application topically at bedtime. ) 28.35 g 5 Taking  . Multiple Vitamin (MULTIVITAMIN WITH MINERALS) TABS tablet Take 1 tablet by mouth daily.     . Potassium 99 MG TABS Take 99 mg by mouth daily.     . psyllium (REGULOID) 0.52 g capsule Take 0.52 g by mouth daily as needed (constipation).     . traZODone (DESYREL) 50 MG tablet TAKE 1 TABLET BY MOUTH AT BEDTIME  (Patient taking differently: Take 50 mg by mouth at bedtime as needed for sleep. ) 90 tablet 0 Taking  . valACYclovir (VALTREX) 500 MG tablet TAKE 1 TABLET (500 MG TOTAL) BY MOUTH EVERY MORNING. (Patient taking differently: Take 500 mg by mouth daily. ) 90 tablet 0 Taking   No current facility-administered medications for this visit.    Allergies  Allergen Reactions  . Codeine Itching    Tolerates with benadryl     Social History   Tobacco Use  . Smoking status: Never Smoker  . Smokeless tobacco: Never Used  Substance Use Topics  . Alcohol use: Yes    Alcohol/week: 2.0 standard drinks    Types: 1 Cans of beer, 1 Shots of liquor per week    Comment: 2- drimks daily    Family History  Problem Relation Age of Onset  . Arthritis Mother   . Asthma Mother   . Breast cancer Maternal Aunt      Review of Systems  Constitutional: Negative.   HENT: Negative.   Eyes: Negative.   Respiratory: Negative.   Cardiovascular: Negative.   Gastrointestinal: Negative.   Genitourinary: Negative.   Musculoskeletal: Positive for joint pain.  Skin: Negative.   Neurological: Negative.   Endo/Heme/Allergies: Negative.   Psychiatric/Behavioral: Negative.     Objective:  Physical Exam  Vitals reviewed. Constitutional: She is oriented to person, place, and time. She appears well-developed and well-nourished.  HENT:  Head: Normocephalic and atraumatic.  Eyes: Pupils are equal, round, and reactive to light. Conjunctivae and EOM are normal.  Neck: Normal range of motion. Neck supple.  Cardiovascular: Normal rate, regular rhythm and intact distal pulses.  Respiratory: Effort normal. No respiratory distress.  GI: Soft. She exhibits no distension.  Genitourinary:  Genitourinary Comments: deferred  Musculoskeletal:       Left knee: She exhibits decreased range of motion, swelling, effusion and abnormal alignment. Tenderness found. Medial joint line and lateral joint line tenderness noted.   Neurological: She is alert and oriented to person, place, and time. She has normal reflexes.  Skin: Skin is warm and dry.  Psychiatric: She has a normal mood and affect. Her behavior is normal. Judgment and thought content normal.    Vital signs in last 24 hours: @VSRANGES @  Labs:   Estimated body mass index is 23.24 kg/m as calculated from the following:   Height as of 07/02/18: 5\' 1"  (1.549 m).   Weight as of 07/02/18: 55.8 kg.   Imaging Review Plain radiographs demonstrate severe degenerative joint disease of the left knee(s). The overall alignment issignificant varus. The bone quality appears to be adequate for age and reported activity level.   Preoperative templating of the joint replacement has been completed, documented, and submitted to the Operating Room personnel in order to optimize intra-operative equipment management.    Patient's anticipated LOS is less than 2 midnights, meeting these requirements: -  Younger than 79 - Lives within 1 hour of care - Has a competent adult at home to recover with post-op recover - NO history of  - Chronic pain requiring opiods  - Diabetes  - Coronary Artery Disease  - Heart failure  - Heart attack  - Stroke  - DVT/VTE  - Cardiac arrhythmia  - Respiratory Failure/COPD  - Renal failure  - Anemia  - Advanced Liver disease        Assessment/Plan:  End stage arthritis, left knee   The patient history, physical examination, clinical judgment of the provider and imaging studies are consistent with end stage degenerative joint disease of the left knee(s) and total knee arthroplasty is deemed medically necessary. The treatment options including medical management, injection therapy arthroscopy and arthroplasty were discussed at length. The risks and benefits of total knee arthroplasty were presented and reviewed. The risks due to aseptic loosening, infection, stiffness, patella tracking problems, thromboembolic complications and  other imponderables were discussed. The patient acknowledged the explanation, agreed to proceed with the plan and consent was signed. Patient is being admitted for inpatient treatment for surgery, pain control, PT, OT, prophylactic antibiotics, VTE prophylaxis, progressive ambulation and ADL's and discharge planning. The patient is planning to be discharged home with outpatient PT. Has DME.

## 2018-07-06 ENCOUNTER — Other Ambulatory Visit: Payer: Self-pay | Admitting: Family Medicine

## 2018-07-07 NOTE — Telephone Encounter (Signed)
[facility] is requesting to fill pt valtrex. Please advise KH 

## 2018-07-10 ENCOUNTER — Encounter (HOSPITAL_COMMUNITY)
Admission: RE | Disposition: A | Discharge status: Home / Self Care | Payer: Self-pay | Source: Home / Self Care | Attending: Orthopedic Surgery

## 2018-07-10 ENCOUNTER — Other Ambulatory Visit: Payer: Self-pay

## 2018-07-10 ENCOUNTER — Inpatient Hospital Stay (HOSPITAL_COMMUNITY): Payer: BLUE CROSS/BLUE SHIELD

## 2018-07-10 ENCOUNTER — Encounter (HOSPITAL_COMMUNITY): Payer: Self-pay | Admitting: *Deleted

## 2018-07-10 ENCOUNTER — Inpatient Hospital Stay (HOSPITAL_COMMUNITY): Payer: BLUE CROSS/BLUE SHIELD | Admitting: Registered Nurse

## 2018-07-10 ENCOUNTER — Inpatient Hospital Stay (HOSPITAL_COMMUNITY)
Admission: RE | Admit: 2018-07-10 | Discharge: 2018-07-11 | DRG: 470 | Disposition: A | Discharge status: Home / Self Care | Payer: BLUE CROSS/BLUE SHIELD | Attending: Orthopedic Surgery | Admitting: Orthopedic Surgery

## 2018-07-10 DIAGNOSIS — Z885 Allergy status to narcotic agent status: Secondary | ICD-10-CM | POA: Diagnosis not present

## 2018-07-10 DIAGNOSIS — Z96651 Presence of right artificial knee joint: Secondary | ICD-10-CM | POA: Diagnosis not present

## 2018-07-10 DIAGNOSIS — L80 Vitiligo: Secondary | ICD-10-CM | POA: Diagnosis not present

## 2018-07-10 DIAGNOSIS — Z8261 Family history of arthritis: Secondary | ICD-10-CM | POA: Diagnosis not present

## 2018-07-10 DIAGNOSIS — M1712 Unilateral primary osteoarthritis, left knee: Principal | ICD-10-CM | POA: Diagnosis present

## 2018-07-10 DIAGNOSIS — F901 Attention-deficit hyperactivity disorder, predominantly hyperactive type: Secondary | ICD-10-CM | POA: Diagnosis not present

## 2018-07-10 DIAGNOSIS — Z825 Family history of asthma and other chronic lower respiratory diseases: Secondary | ICD-10-CM | POA: Diagnosis not present

## 2018-07-10 DIAGNOSIS — Z803 Family history of malignant neoplasm of breast: Secondary | ICD-10-CM

## 2018-07-10 DIAGNOSIS — E785 Hyperlipidemia, unspecified: Secondary | ICD-10-CM | POA: Diagnosis present

## 2018-07-10 DIAGNOSIS — I341 Nonrheumatic mitral (valve) prolapse: Secondary | ICD-10-CM | POA: Diagnosis present

## 2018-07-10 DIAGNOSIS — Z7982 Long term (current) use of aspirin: Secondary | ICD-10-CM | POA: Diagnosis not present

## 2018-07-10 DIAGNOSIS — G8918 Other acute postprocedural pain: Secondary | ICD-10-CM | POA: Diagnosis not present

## 2018-07-10 DIAGNOSIS — Z8371 Family history of colonic polyps: Secondary | ICD-10-CM

## 2018-07-10 DIAGNOSIS — I10 Essential (primary) hypertension: Secondary | ICD-10-CM | POA: Diagnosis present

## 2018-07-10 DIAGNOSIS — T84093A Other mechanical complication of internal left knee prosthesis, initial encounter: Secondary | ICD-10-CM

## 2018-07-10 DIAGNOSIS — Z79899 Other long term (current) drug therapy: Secondary | ICD-10-CM | POA: Diagnosis not present

## 2018-07-10 DIAGNOSIS — R011 Cardiac murmur, unspecified: Secondary | ICD-10-CM | POA: Diagnosis present

## 2018-07-10 DIAGNOSIS — Z471 Aftercare following joint replacement surgery: Secondary | ICD-10-CM | POA: Diagnosis not present

## 2018-07-10 DIAGNOSIS — Z800 Family history of malignant neoplasm of digestive organs: Secondary | ICD-10-CM | POA: Diagnosis not present

## 2018-07-10 DIAGNOSIS — Z96652 Presence of left artificial knee joint: Secondary | ICD-10-CM | POA: Diagnosis not present

## 2018-07-10 HISTORY — PX: KNEE ARTHROPLASTY: SHX992

## 2018-07-10 LAB — TYPE AND SCREEN
ABO/RH(D): O POS
Antibody Screen: NEGATIVE

## 2018-07-10 SURGERY — ARTHROPLASTY, KNEE, TOTAL, USING IMAGELESS COMPUTER-ASSISTED NAVIGATION
Anesthesia: Spinal | Site: Knee | Laterality: Left

## 2018-07-10 MED ORDER — HYDROCODONE-ACETAMINOPHEN 5-325 MG PO TABS
1.0000 | ORAL_TABLET | ORAL | Status: DC | PRN
  Administered 2018-07-10 – 2018-07-11 (×2): 2 via ORAL
  Administered 2018-07-11: 1 via ORAL
  Administered 2018-07-11: 2 via ORAL
  Filled 2018-07-10 (×3): qty 2
  Filled 2018-07-10: qty 1

## 2018-07-10 MED ORDER — METHOCARBAMOL 500 MG IVPB - SIMPLE MED
500.0000 mg | Freq: Four times a day (QID) | INTRAVENOUS | Status: DC | PRN
  Administered 2018-07-10: 500 mg via INTRAVENOUS
  Filled 2018-07-10: qty 50

## 2018-07-10 MED ORDER — SODIUM CHLORIDE 0.9 % IR SOLN
Status: DC | PRN
  Administered 2018-07-10: 1000 mL

## 2018-07-10 MED ORDER — LACTATED RINGERS IV SOLN
INTRAVENOUS | Status: DC
  Administered 2018-07-10 (×2): via INTRAVENOUS

## 2018-07-10 MED ORDER — AMPHETAMINE-DEXTROAMPHET ER 10 MG PO CP24: 20.0000 mg | ORAL_CAPSULE | Freq: Every day | ORAL | Status: DC | PRN

## 2018-07-10 MED ORDER — ONDANSETRON HCL 4 MG/2ML IJ SOLN
INTRAMUSCULAR | Status: DC | PRN
  Administered 2018-07-10: 4 mg via INTRAVENOUS

## 2018-07-10 MED ORDER — HYDROMORPHONE HCL 1 MG/ML IJ SOLN: 0.2500 mg | INTRAMUSCULAR | Status: DC | PRN

## 2018-07-10 MED ORDER — ACETAMINOPHEN 325 MG PO TABS: 325.0000 mg | ORAL_TABLET | Freq: Four times a day (QID) | ORAL | Status: DC | PRN

## 2018-07-10 MED ORDER — FENTANYL CITRATE (PF) 100 MCG/2ML IJ SOLN
INTRAMUSCULAR | Status: AC
  Administered 2018-07-10: 50 ug via INTRAVENOUS
  Filled 2018-07-10: qty 2

## 2018-07-10 MED ORDER — EPHEDRINE 5 MG/ML INJ
INTRAVENOUS | Status: AC
  Filled 2018-07-10: qty 10

## 2018-07-10 MED ORDER — CEFAZOLIN SODIUM-DEXTROSE 2-4 GM/100ML-% IV SOLN
2.0000 g | Freq: Four times a day (QID) | INTRAVENOUS | Status: AC
  Administered 2018-07-10 – 2018-07-11 (×2): 2 g via INTRAVENOUS
  Filled 2018-07-10 (×2): qty 100

## 2018-07-10 MED ORDER — ONDANSETRON HCL 4 MG PO TABS: 4.0000 mg | ORAL_TABLET | Freq: Four times a day (QID) | ORAL | Status: DC | PRN

## 2018-07-10 MED ORDER — SENNA 8.6 MG PO TABS
1.0000 | ORAL_TABLET | Freq: Two times a day (BID) | ORAL | Status: DC
  Administered 2018-07-10 – 2018-07-11 (×2): 8.6 mg via ORAL
  Filled 2018-07-10 (×2): qty 1

## 2018-07-10 MED ORDER — CEFAZOLIN SODIUM-DEXTROSE 2-4 GM/100ML-% IV SOLN
2.0000 g | INTRAVENOUS | Status: AC
  Administered 2018-07-10: 2 g via INTRAVENOUS
  Filled 2018-07-10: qty 100

## 2018-07-10 MED ORDER — FENTANYL CITRATE (PF) 100 MCG/2ML IJ SOLN
50.0000 ug | Freq: Once | INTRAMUSCULAR | Status: AC | PRN
  Administered 2018-07-10: 50 ug via INTRAVENOUS

## 2018-07-10 MED ORDER — METOCLOPRAMIDE HCL 5 MG/ML IJ SOLN: 5.0000 mg | Freq: Three times a day (TID) | INTRAMUSCULAR | Status: DC | PRN

## 2018-07-10 MED ORDER — KETOROLAC TROMETHAMINE 15 MG/ML IJ SOLN
15.0000 mg | Freq: Four times a day (QID) | INTRAMUSCULAR | Status: AC
  Administered 2018-07-10 – 2018-07-11 (×4): 15 mg via INTRAVENOUS
  Filled 2018-07-10 (×4): qty 1

## 2018-07-10 MED ORDER — CHLORHEXIDINE GLUCONATE 4 % EX LIQD: 60.0000 mL | Freq: Once | CUTANEOUS | Status: DC

## 2018-07-10 MED ORDER — BUPIVACAINE HCL (PF) 0.25 % IJ SOLN
INTRAMUSCULAR | Status: DC | PRN
  Administered 2018-07-10: 30 mL

## 2018-07-10 MED ORDER — ROPIVACAINE HCL 7.5 MG/ML IJ SOLN
INTRAMUSCULAR | Status: DC | PRN
  Administered 2018-07-10: 20 mL via PERINEURAL

## 2018-07-10 MED ORDER — ONDANSETRON HCL 4 MG/2ML IJ SOLN: 4.0000 mg | Freq: Four times a day (QID) | INTRAMUSCULAR | Status: DC | PRN

## 2018-07-10 MED ORDER — DEXAMETHASONE SODIUM PHOSPHATE 10 MG/ML IJ SOLN
INTRAMUSCULAR | Status: DC | PRN
  Administered 2018-07-10: 10 mg via INTRAVENOUS

## 2018-07-10 MED ORDER — PROPOFOL 500 MG/50ML IV EMUL
INTRAVENOUS | Status: DC | PRN
  Administered 2018-07-10: 40 ug/kg/min via INTRAVENOUS

## 2018-07-10 MED ORDER — 0.9 % SODIUM CHLORIDE (POUR BTL) OPTIME
TOPICAL | Status: DC | PRN
  Administered 2018-07-10: 1000 mL

## 2018-07-10 MED ORDER — HYDROCODONE-ACETAMINOPHEN 7.5-325 MG PO TABS: 1.0000 | ORAL_TABLET | ORAL | Status: DC | PRN

## 2018-07-10 MED ORDER — METHOCARBAMOL 500 MG PO TABS
500.0000 mg | ORAL_TABLET | Freq: Four times a day (QID) | ORAL | Status: DC | PRN
  Administered 2018-07-11 (×2): 500 mg via ORAL
  Filled 2018-07-10 (×2): qty 1

## 2018-07-10 MED ORDER — ISOPROPYL ALCOHOL 70 % SOLN
Status: DC | PRN
  Administered 2018-07-10: 1 via TOPICAL

## 2018-07-10 MED ORDER — DEXAMETHASONE SODIUM PHOSPHATE 10 MG/ML IJ SOLN
10.0000 mg | Freq: Once | INTRAMUSCULAR | Status: AC
  Administered 2018-07-11: 10 mg via INTRAVENOUS
  Filled 2018-07-10: qty 1

## 2018-07-10 MED ORDER — SODIUM CHLORIDE 0.9 % IR SOLN
Status: DC | PRN
  Administered 2018-07-10: 3000 mL

## 2018-07-10 MED ORDER — DILTIAZEM HCL ER COATED BEADS 120 MG PO CP24
120.0000 mg | ORAL_CAPSULE | Freq: Every day | ORAL | Status: DC
  Administered 2018-07-11: 120 mg via ORAL
  Filled 2018-07-10: qty 1

## 2018-07-10 MED ORDER — PROPOFOL 10 MG/ML IV BOLUS
INTRAVENOUS | Status: AC
  Filled 2018-07-10: qty 20

## 2018-07-10 MED ORDER — POLYETHYLENE GLYCOL 3350 17 G PO PACK: 17.0000 g | PACK | Freq: Every day | ORAL | Status: DC | PRN

## 2018-07-10 MED ORDER — MIDAZOLAM HCL 2 MG/2ML IJ SOLN
1.0000 mg | Freq: Once | INTRAMUSCULAR | Status: AC | PRN
  Administered 2018-07-10: 1 mg via INTRAVENOUS

## 2018-07-10 MED ORDER — SODIUM CHLORIDE 0.9% FLUSH
INTRAVENOUS | Status: DC | PRN
  Administered 2018-07-10: 30 mL

## 2018-07-10 MED ORDER — PROPOFOL 10 MG/ML IV BOLUS
INTRAVENOUS | Status: AC
  Filled 2018-07-10: qty 40

## 2018-07-10 MED ORDER — TRAZODONE HCL 50 MG PO TABS: 50.0000 mg | ORAL_TABLET | Freq: Every evening | ORAL | Status: DC | PRN

## 2018-07-10 MED ORDER — HYDROCHLOROTHIAZIDE 12.5 MG PO CAPS
12.5000 mg | ORAL_CAPSULE | Freq: Every day | ORAL | Status: DC
  Administered 2018-07-11: 12.5 mg via ORAL
  Filled 2018-07-10: qty 1

## 2018-07-10 MED ORDER — SODIUM CHLORIDE 0.9 % IV SOLN: INTRAVENOUS | Status: DC

## 2018-07-10 MED ORDER — METHOCARBAMOL 500 MG IVPB - SIMPLE MED
INTRAVENOUS | Status: AC
  Administered 2018-07-10: 500 mg via INTRAVENOUS
  Filled 2018-07-10: qty 50

## 2018-07-10 MED ORDER — PHENOL 1.4 % MT LIQD: 1.0000 | OROMUCOSAL | Status: DC | PRN

## 2018-07-10 MED ORDER — ACETAMINOPHEN 10 MG/ML IV SOLN
1000.0000 mg | INTRAVENOUS | Status: AC
  Administered 2018-07-10: 1000 mg via INTRAVENOUS
  Filled 2018-07-10: qty 100

## 2018-07-10 MED ORDER — FLUTICASONE PROPIONATE 50 MCG/ACT NA SUSP: 1.0000 | Freq: Every evening | NASAL | Status: DC | PRN

## 2018-07-10 MED ORDER — MEPERIDINE HCL 50 MG/ML IJ SOLN: 6.2500 mg | INTRAMUSCULAR | Status: DC | PRN

## 2018-07-10 MED ORDER — PROMETHAZINE HCL 25 MG/ML IJ SOLN: 6.2500 mg | INTRAMUSCULAR | Status: DC | PRN

## 2018-07-10 MED ORDER — METOCLOPRAMIDE HCL 5 MG PO TABS: 5.0000 mg | ORAL_TABLET | Freq: Three times a day (TID) | ORAL | Status: DC | PRN

## 2018-07-10 MED ORDER — BUPIVACAINE IN DEXTROSE 0.75-8.25 % IT SOLN
INTRATHECAL | Status: DC | PRN
  Administered 2018-07-10: 1.7 mL via INTRATHECAL

## 2018-07-10 MED ORDER — POVIDONE-IODINE 10 % EX SWAB
2.0000 "application " | Freq: Once | CUTANEOUS | Status: AC
  Administered 2018-07-10: 2 via TOPICAL

## 2018-07-10 MED ORDER — MIDAZOLAM HCL 2 MG/2ML IJ SOLN
INTRAMUSCULAR | Status: AC
  Administered 2018-07-10: 1 mg via INTRAVENOUS
  Filled 2018-07-10: qty 2

## 2018-07-10 MED ORDER — TRANEXAMIC ACID-NACL 1000-0.7 MG/100ML-% IV SOLN
1000.0000 mg | INTRAVENOUS | Status: AC
  Administered 2018-07-10: 1000 mg via INTRAVENOUS
  Filled 2018-07-10: qty 100

## 2018-07-10 MED ORDER — MORPHINE SULFATE (PF) 2 MG/ML IV SOLN: 0.5000 mg | INTRAVENOUS | Status: DC | PRN

## 2018-07-10 MED ORDER — BUPIVACAINE HCL (PF) 0.25 % IJ SOLN
INTRAMUSCULAR | Status: AC
  Filled 2018-07-10: qty 30

## 2018-07-10 MED ORDER — ASPIRIN 81 MG PO CHEW
81.0000 mg | CHEWABLE_TABLET | Freq: Two times a day (BID) | ORAL | Status: DC
  Administered 2018-07-10 – 2018-07-11 (×2): 81 mg via ORAL
  Filled 2018-07-10 (×2): qty 1

## 2018-07-10 MED ORDER — VITAMIN B-12 100 MCG PO TABS
100.0000 ug | ORAL_TABLET | Freq: Every day | ORAL | Status: DC
  Administered 2018-07-11: 100 ug via ORAL
  Filled 2018-07-10 (×3): qty 1

## 2018-07-10 MED ORDER — KETOROLAC TROMETHAMINE 30 MG/ML IJ SOLN
INTRAMUSCULAR | Status: DC | PRN
  Administered 2018-07-10: 30 mg

## 2018-07-10 MED ORDER — MENTHOL 3 MG MT LOZG: 1.0000 | LOZENGE | OROMUCOSAL | Status: DC | PRN

## 2018-07-10 MED ORDER — VALACYCLOVIR HCL 500 MG PO TABS
500.0000 mg | ORAL_TABLET | Freq: Every day | ORAL | Status: DC
  Administered 2018-07-11: 500 mg via ORAL
  Filled 2018-07-10: qty 1

## 2018-07-10 MED ORDER — KETOROLAC TROMETHAMINE 30 MG/ML IJ SOLN
INTRAMUSCULAR | Status: AC
  Filled 2018-07-10: qty 1

## 2018-07-10 MED ORDER — POTASSIUM CHLORIDE CRYS ER 10 MEQ PO TBCR
10.0000 meq | EXTENDED_RELEASE_TABLET | Freq: Every day | ORAL | Status: DC
  Administered 2018-07-11: 10 meq via ORAL
  Filled 2018-07-10: qty 1

## 2018-07-10 MED ORDER — SODIUM CHLORIDE 0.9 % IV SOLN
INTRAVENOUS | Status: DC
  Administered 2018-07-10 – 2018-07-11 (×2): via INTRAVENOUS

## 2018-07-10 MED ORDER — SODIUM CHLORIDE 0.9 % IJ SOLN
INTRAMUSCULAR | Status: AC
  Filled 2018-07-10: qty 50

## 2018-07-10 MED ORDER — DIPHENHYDRAMINE HCL 12.5 MG/5ML PO ELIX
12.5000 mg | ORAL_SOLUTION | ORAL | Status: DC | PRN
  Administered 2018-07-11 (×2): 25 mg via ORAL
  Filled 2018-07-10 (×2): qty 10

## 2018-07-10 MED ORDER — PHENYLEPHRINE 40 MCG/ML (10ML) SYRINGE FOR IV PUSH (FOR BLOOD PRESSURE SUPPORT)
PREFILLED_SYRINGE | INTRAVENOUS | Status: DC | PRN
  Administered 2018-07-10 (×5): 80 ug via INTRAVENOUS

## 2018-07-10 MED ORDER — EPHEDRINE SULFATE-NACL 50-0.9 MG/10ML-% IV SOSY
PREFILLED_SYRINGE | INTRAVENOUS | Status: DC | PRN
  Administered 2018-07-10: 10 mg via INTRAVENOUS
  Administered 2018-07-10: 5 mg via INTRAVENOUS
  Administered 2018-07-10: 10 mg via INTRAVENOUS

## 2018-07-10 MED ORDER — PHENYLEPHRINE 40 MCG/ML (10ML) SYRINGE FOR IV PUSH (FOR BLOOD PRESSURE SUPPORT)
PREFILLED_SYRINGE | INTRAVENOUS | Status: AC
  Filled 2018-07-10: qty 10

## 2018-07-10 MED ORDER — DOCUSATE SODIUM 100 MG PO CAPS
100.0000 mg | ORAL_CAPSULE | Freq: Two times a day (BID) | ORAL | Status: DC
  Administered 2018-07-10 – 2018-07-11 (×2): 100 mg via ORAL
  Filled 2018-07-10 (×2): qty 1

## 2018-07-10 MED ORDER — MIDAZOLAM HCL 2 MG/2ML IJ SOLN: 0.5000 mg | Freq: Once | INTRAMUSCULAR | Status: DC | PRN

## 2018-07-10 MED ORDER — PSYLLIUM 95 % PO PACK: 1.0000 | PACK | Freq: Every day | ORAL | Status: DC | PRN

## 2018-07-10 MED ORDER — HYDROMORPHONE HCL 1 MG/ML IJ SOLN
INTRAMUSCULAR | Status: AC
  Filled 2018-07-10: qty 1

## 2018-07-10 MED ORDER — ALUM & MAG HYDROXIDE-SIMETH 200-200-20 MG/5ML PO SUSP: 30.0000 mL | ORAL | Status: DC | PRN

## 2018-07-10 SURGICAL SUPPLY — 56 items
ADH SKN CLS APL DERMABOND .7 (GAUZE/BANDAGES/DRESSINGS) ×1
BANDAGE ACE 4X5 VEL STRL LF (GAUZE/BANDAGES/DRESSINGS) ×2 IMPLANT
BANDAGE ACE 6X5 VEL STRL LF (GAUZE/BANDAGES/DRESSINGS) ×2 IMPLANT
BATTERY INSTRU NAVIGATION (MISCELLANEOUS) ×6 IMPLANT
BLADE SAW RECIPROCATING 77.5 (BLADE) ×2 IMPLANT
BSPLAT TIB 3 KN TRITANIUM (Knees) ×1 IMPLANT
BTRY SRG DRVR LF (MISCELLANEOUS) ×3
CHLORAPREP W/TINT 26ML (MISCELLANEOUS) ×4 IMPLANT
COVER SURGICAL LIGHT HANDLE (MISCELLANEOUS) ×2 IMPLANT
CUFF TOURN SGL QUICK 34 (TOURNIQUET CUFF) ×2
CUFF TRNQT CYL 34X4X40X1 (TOURNIQUET CUFF) ×1 IMPLANT
DECANTER SPIKE VIAL GLASS SM (MISCELLANEOUS) ×4 IMPLANT
DERMABOND ADVANCED (GAUZE/BANDAGES/DRESSINGS) ×1
DERMABOND ADVANCED .7 DNX12 (GAUZE/BANDAGES/DRESSINGS) ×2 IMPLANT
DRAPE SHEET LG 3/4 BI-LAMINATE (DRAPES) ×4 IMPLANT
DRAPE U-SHAPE 47X51 STRL (DRAPES) ×2 IMPLANT
DRSG AQUACEL AG ADV 3.5X10 (GAUZE/BANDAGES/DRESSINGS) ×2 IMPLANT
ELECT BLADE TIP CTD 4 INCH (ELECTRODE) ×2 IMPLANT
ELECT REM PT RETURN 15FT ADLT (MISCELLANEOUS) ×2 IMPLANT
FEMORAL RETAINING CRUC KNEE #4 (Knees) ×1 IMPLANT
GAUZE SPONGE 4X4 12PLY STRL (GAUZE/BANDAGES/DRESSINGS) ×2 IMPLANT
GLOVE BIO SURGEON STRL SZ8.5 (GLOVE) ×4 IMPLANT
GLOVE BIOGEL PI IND STRL 8.5 (GLOVE) ×1 IMPLANT
GLOVE BIOGEL PI INDICATOR 8.5 (GLOVE) ×1
GOWN SPEC L3 XXLG W/TWL (GOWN DISPOSABLE) ×2 IMPLANT
HANDPIECE INTERPULSE COAX TIP (DISPOSABLE) ×2
HOOD PEEL AWAY FLYTE STAYCOOL (MISCELLANEOUS) ×6 IMPLANT
INSERT TIBIA KNEE SZ 3 9MM (Insert) ×1 IMPLANT
KNEE PATELLA ASYMMETRIC 9X29 (Knees) ×1 IMPLANT
KNEE TIBIAL COMPONENT SZ3 (Knees) ×1 IMPLANT
MARKER SKIN DUAL TIP RULER LAB (MISCELLANEOUS) ×2 IMPLANT
NDL SPNL 18GX3.5 QUINCKE PK (NEEDLE) ×1 IMPLANT
NEEDLE SPNL 18GX3.5 QUINCKE PK (NEEDLE) ×2 IMPLANT
NS IRRIG 1000ML POUR BTL (IV SOLUTION) ×2 IMPLANT
PACK TOTAL KNEE CUSTOM (KITS) ×2 IMPLANT
PADDING CAST COTTON 6X4 STRL (CAST SUPPLIES) ×2 IMPLANT
POSITIONER SURGICAL ARM (MISCELLANEOUS) ×2 IMPLANT
SAW OSC TIP CART 19.5X105X1.3 (SAW) ×2 IMPLANT
SEALER BIPOLAR AQUA 6.0 (INSTRUMENTS) ×2 IMPLANT
SET HNDPC FAN SPRY TIP SCT (DISPOSABLE) ×1 IMPLANT
SET PAD KNEE POSITIONER (MISCELLANEOUS) ×2 IMPLANT
SUT MNCRL AB 3-0 PS2 18 (SUTURE) ×2 IMPLANT
SUT MNCRL AB 4-0 PS2 18 (SUTURE) ×1 IMPLANT
SUT MON AB 2-0 CT1 36 (SUTURE) ×4 IMPLANT
SUT STRATAFIX PDO 1 14 VIOLET (SUTURE) ×2
SUT STRATFX PDO 1 14 VIOLET (SUTURE) ×1
SUT VIC AB 1 CTX 36 (SUTURE) ×4
SUT VIC AB 1 CTX36XBRD ANBCTR (SUTURE) ×2 IMPLANT
SUT VIC AB 2-0 CT1 27 (SUTURE) ×2
SUT VIC AB 2-0 CT1 TAPERPNT 27 (SUTURE) ×1 IMPLANT
SUTURE STRATFX PDO 1 14 VIOLET (SUTURE) ×1 IMPLANT
SYR 50ML LL SCALE MARK (SYRINGE) ×2 IMPLANT
TRAY FOLEY CATH 14FR (SET/KITS/TRAYS/PACK) ×1 IMPLANT
WATER STERILE IRR 1000ML POUR (IV SOLUTION) ×4 IMPLANT
WRAP KNEE MAXI GEL POST OP (GAUZE/BANDAGES/DRESSINGS) ×2 IMPLANT
YANKAUER SUCT BULB TIP 10FT TU (MISCELLANEOUS) ×2 IMPLANT

## 2018-07-10 NOTE — Anesthesia Preprocedure Evaluation (Addendum)
Anesthesia Evaluation  Patient identified by MRN, date of birth, ID band Patient awake    Reviewed: Allergy & Precautions, NPO status , Patient's Chart, lab work & pertinent test results  History of Anesthesia Complications Negative for: history of anesthetic complications  Airway Mallampati: II  TM Distance: >3 FB Neck ROM: Full    Dental  (+) Caps, Dental Advisory Given, Teeth Intact   Pulmonary neg pulmonary ROS,    breath sounds clear to auscultation       Cardiovascular hypertension, Pt. on medications (-) angina+ Valvular Problems/Murmurs MR  Rhythm:Regular Rate:Normal + Systolic murmurs 4/80 ECHO: EF 60-65%, mod MR without prolapse, mild TR   Neuro/Psych negative neurological ROS     GI/Hepatic negative GI ROS, Neg liver ROS,   Endo/Other  negative endocrine ROS  Renal/GU negative Renal ROS     Musculoskeletal  (+) Arthritis ,   Abdominal   Peds  Hematology negative hematology ROS (+)   Anesthesia Other Findings   Reproductive/Obstetrics                            Anesthesia Physical Anesthesia Plan  ASA: II  Anesthesia Plan: Spinal   Post-op Pain Management:  Regional for Post-op pain   Induction:   PONV Risk Score and Plan: 2 and Ondansetron and Dexamethasone  Airway Management Planned: Natural Airway and Nasal Cannula  Additional Equipment:   Intra-op Plan:   Post-operative Plan:   Informed Consent: I have reviewed the patients History and Physical, chart, labs and discussed the procedure including the risks, benefits and alternatives for the proposed anesthesia with the patient or authorized representative who has indicated his/her understanding and acceptance.   Dental advisory given  Plan Discussed with: CRNA and Surgeon  Anesthesia Plan Comments: (Plan routine monitors, SAB with adductor canal block for post op analgesia)        Anesthesia Quick  Evaluation

## 2018-07-10 NOTE — Transfer of Care (Signed)
Immediate Anesthesia Transfer of Care Note  Patient: Annette Murray  Procedure(s) Performed: LEFT TOTAL KNEE ARTHROPLASTY WITH COMPUTER NAVIGATION (Left Knee)  Patient Location: PACU  Anesthesia Type:Spinal and MAC combined with regional for post-op pain  Level of Consciousness: awake, alert , oriented and patient cooperative  Airway & Oxygen Therapy: Patient Spontanous Breathing and Patient connected to face mask oxygen  Post-op Assessment: Report given to RN and Post -op Vital signs reviewed and stable  Post vital signs: Reviewed and stable  Last Vitals:  Vitals Value Taken Time  BP 99/61 07/10/2018  3:03 PM  Temp    Pulse 64 07/10/2018  3:07 PM  Resp 10 07/10/2018  3:07 PM  SpO2 97 % 07/10/2018  3:07 PM  Vitals shown include unvalidated device data.  Last Pain:  Vitals:   07/10/18 1029  TempSrc:   PainSc: 0-No pain         Complications: No apparent anesthesia complications

## 2018-07-10 NOTE — Interval H&P Note (Signed)
History and Physical Interval Note:  07/10/2018 11:10 AM  Annette Murray  has presented today for surgery, with the diagnosis of Degenerative joint disease left knee  The various methods of treatment have been discussed with the patient and family. After consideration of risks, benefits and other options for treatment, the patient has consented to  Procedure(s) with comments: LEFT TOTAL KNEE ARTHROPLASTY WITH COMPUTER NAVIGATION (Left) - Needs RNFA as a surgical intervention .  The patient's history has been reviewed, patient examined, no change in status, stable for surgery.  I have reviewed the patient's chart and labs.  Questions were answered to the patient's satisfaction.     Hilton Cork Rayven Hendrickson

## 2018-07-10 NOTE — Discharge Instructions (Signed)
° °Dr. Hendryx Ricke °Total Joint Specialist °[facility] °[address] °[place], [place] [address] °[phone] ° °TOTAL KNEE REPLACEMENT POSTOPERATIVE DIRECTIONS ° ° ° °Knee Rehabilitation, Guidelines Following Surgery  °Results after knee surgery are often greatly improved when you follow the exercise, range of motion and muscle strengthening exercises prescribed by your doctor. Safety measures are also important to protect the knee from further injury. Any time any of these exercises cause you to have increased pain or swelling in your knee joint, decrease the amount until you are comfortable again and slowly increase them. If you have problems or questions, call your caregiver or physical therapist for advice.  ° °WEIGHT BEARING °Weight bearing as tolerated with assist device (walker, cane, etc) as directed, use it as long as suggested by your surgeon or therapist, typically at least 4-6 weeks. ° °HOME CARE INSTRUCTIONS  °Remove items at home which could result in a fall. This includes throw rugs or furniture in walking pathways.  °Continue medications as instructed at time of discharge. °You may have some home medications which will be placed on hold until you complete the course of blood thinner medication.  °You may start showering once you are discharged home but do not submerge the incision under water. Just pat the incision dry and apply a dry gauze dressing on daily. °Walk with walker as instructed.  °You may resume a sexual relationship in one month or when given the OK by your doctor.  °· Use walker as long as suggested by your caregivers. °· Avoid periods of inactivity such as sitting longer than an hour when not asleep. This helps prevent blood clots.  °You may put full weight on your legs and walk as much as is comfortable.  °You may return to work once you are cleared by your doctor.  °Do not drive a car for 6 weeks or until released by you surgeon.  °· Do not drive  while taking narcotics.  °Wear the elastic stockings for three weeks following surgery during the day but you may remove then at night. °Make sure you keep all of your appointments after your operation with all of your doctors and caregivers. You should call the office at the above phone number and make an appointment for approximately two weeks after the date of your surgery. °Do not remove your surgical dressing. The dressing is waterproof; you may take showers in 3 days, but do not take tub baths or submerge the dressing. °Please pick up a stool softener and laxative for home use as long as you are requiring pain medications. °· ICE to the affected knee every three hours for 30 minutes at a time and then as needed for pain and swelling.  Continue to use ice on the knee for pain and swelling from surgery. You may notice swelling that will progress down to the foot and ankle.  This is normal after surgery.  Elevate the leg when you are not up walking on it.   °It is important for you to complete the blood thinner medication as prescribed by your doctor. °· Continue to use the breathing machine which will help keep your temperature down.  It is common for your temperature to cycle up and down following surgery, especially at night when you are not up moving around and exerting yourself.  The breathing machine keeps your lungs expanded and your temperature down. ° °RANGE OF MOTION AND STRENGTHENING EXERCISES  °Rehabilitation of the knee is important following   a knee injury or an operation. After just a few days of immobilization, the muscles of the thigh which control the knee become weakened and shrink (atrophy). Knee exercises are designed to build up the tone and strength of the thigh muscles and to improve knee motion. Often times heat used for twenty to thirty minutes before working out will loosen up your tissues and help with improving the range of motion but do not use heat for the first two weeks following  surgery. These exercises can be done on a training (exercise) mat, on the floor, on a table or on a bed. Use what ever works the best and is most comfortable for you Knee exercises include:  °Leg Lifts - While your knee is still immobilized in a splint or cast, you can do straight leg raises. Lift the leg to 60 degrees, hold for 3 sec, and slowly lower the leg. Repeat 10-20 times 2-3 times daily. Perform this exercise against resistance later as your knee gets better.  °Quad and Hamstring Sets - Tighten up the muscle on the front of the thigh (Quad) and hold for 5-10 sec. Repeat this 10-20 times hourly. Hamstring sets are done by pushing the foot backward against an object and holding for 5-10 sec. Repeat as with quad sets.  °A rehabilitation program following serious knee injuries can speed recovery and prevent re-injury in the future due to weakened muscles. Contact your doctor or a physical therapist for more information on knee rehabilitation.  ° °SKILLED REHAB INSTRUCTIONS: °If the patient is transferred to a skilled rehab facility following release from the hospital, a list of the current medications will be sent to the facility for the patient to continue.  When discharged from the skilled rehab facility, please have the facility set up the patient's Home Health Physical Therapy prior to being released. Also, the skilled facility will be responsible for providing the patient with their medications at time of release from the facility to include their pain medication, the muscle relaxants, and their blood thinner medication. If the patient is still at the rehab facility at time of the two week follow up appointment, the skilled rehab facility will also need to assist the patient in arranging follow up appointment in our office and any transportation needs. ° °MAKE SURE YOU:  °Understand these instructions.  °Will watch your condition.  °Will get help right away if you are not doing well or get worse.   ° ° °Pick up stool softner and laxative for home use following surgery while on pain medications. °Do NOT remove your dressing. You may shower.  °Do not take tub baths or submerge incision under water. °May shower starting three days after surgery. °Please use a clean towel to pat the incision dry following showers. °Continue to use ice for pain and swelling after surgery. °Do not use any lotions or creams on the incision until instructed by your surgeon. ° °

## 2018-07-10 NOTE — Anesthesia Procedure Notes (Signed)
Spinal  Patient location during procedure: OR End time: 07/10/2018 12:50 PM Staffing Resident/CRNA: Victoriano Lain, CRNA Preanesthetic Checklist Completed: patient identified, site marked, surgical consent, pre-op evaluation, timeout performed, IV checked, risks and benefits discussed and monitors and equipment checked Spinal Block Patient position: sitting Prep: site prepped and draped and DuraPrep Patient monitoring: heart rate, continuous pulse ox and blood pressure Approach: midline Location: L3-4 Injection technique: single-shot Needle Needle type: Pencan  Needle gauge: 24 G Needle length: 10 cm Assessment Sensory level: T4 Additional Notes Pt placed in sttting position for spinal placement. Spinal kit expiration date checked and verified. One attempt. + CSF, - heme. Pt tolerated well.

## 2018-07-10 NOTE — Anesthesia Postprocedure Evaluation (Signed)
Anesthesia Post Note  Patient: Annette Murray  Procedure(s) Performed: LEFT TOTAL KNEE ARTHROPLASTY WITH COMPUTER NAVIGATION (Left Knee)     Patient location during evaluation: PACU Anesthesia Type: Spinal and Regional Level of consciousness: awake and alert, oriented and patient cooperative Pain management: pain level controlled Vital Signs Assessment: post-procedure vital signs reviewed and stable Respiratory status: spontaneous breathing, nonlabored ventilation and respiratory function stable Cardiovascular status: blood pressure returned to baseline and stable Postop Assessment: no apparent nausea or vomiting Anesthetic complications: no    Last Vitals:  Vitals:   07/10/18 1656 07/10/18 1800  BP: 110/79 125/78  Pulse: 68 69  Resp: 15   Temp: 36.7 C 36.4 C  SpO2: 100% 100%    Last Pain:  Vitals:   07/10/18 1800  TempSrc: Oral  PainSc:                  Chalice Philbert,E. Timera Windt

## 2018-07-10 NOTE — Progress Notes (Signed)
Assisted Dr. Carswell Jackson with left, ultrasound guided, adductor canal block. Side rails up, monitors on throughout procedure. See vital signs in flow sheet. Tolerated Procedure well.  

## 2018-07-10 NOTE — Anesthesia Procedure Notes (Signed)
Anesthesia Regional Block: Adductor canal block   Pre-Anesthetic Checklist: ,, timeout performed, Correct Patient, Correct Site, Correct Laterality, Correct Procedure, Correct Position, site marked, Risks and benefits discussed,  Surgical consent,  Pre-op evaluation,  At surgeon's request and post-op pain management  Laterality: Left and Lower  Prep: chloraprep       Needles:  Injection technique: Single-shot  Needle Type: Echogenic Needle     Needle Length: 9cm  Needle Gauge: 21     Additional Needles:   Procedures:,,,, ultrasound used (permanent image in chart),,,,  Narrative:  Start time: 07/10/2018 11:37 AM End time: 07/10/2018 11:43 AM Injection made incrementally with aspirations every 5 mL.  Performed by: Personally  Anesthesiologist: Annye Asa, MD  Additional Notes: Pt identified in Holding room.  Monitors applied. Working IV access confirmed. Sterile prep L thigh.  #21ga ECHOgenic needle into adductor canal with US guidance.  20cc 0.75% Ropivacaine injected incrementally after negative test dose.  Patient asymptomatic, VSS, no heme aspirated, tolerated well.  Jenita Seashore, MD

## 2018-07-10 NOTE — Op Note (Signed)
OPERATIVE REPORT  SURGEON: Rod Can, MD   ASSISTANT: Nehemiah Massed, PA-C.  PREOPERATIVE DIAGNOSIS: Left knee arthritis.   POSTOPERATIVE DIAGNOSIS: Left knee arthritis.   PROCEDURE: Left total knee arthroplasty.   IMPLANTS: Stryker Triathlon CR femur, size 4. Stryker Tritanium tibia, size 3. X3 polyethelyene insert, size 9 mm, CR. 3 button asymmetric patella, size 29 mm.  ANESTHESIA:  Regional and Spinal  TOURNIQUET TIME: Not utilized.   ESTIMATED BLOOD LOSS:-150 mL    ANTIBIOTICS: 2 g Ancef.  DRAINS: None.  COMPLICATIONS: None   CONDITION: PACU - hemodynamically stable.   BRIEF CLINICAL NOTE: Annette Murray is a 62 y.o. female with a long-standing history of Left knee arthritis. After failing conservative management, the patient was indicated for total knee arthroplasty. The risks, benefits, and alternatives to the procedure were explained, and the patient elected to proceed.  PROCEDURE IN DETAIL: Adductor canal block was obtained in the pre-op holding area. Once inside the operative room, spinal anesthesia was obtained, and a foley catheter was inserted. The patient was then positioned, a nonsterile tourniquet was placed, and the lower extremity was prepped and draped in the normal sterile surgical fashion. A time-out was called verifying side and site of surgery. The patient received IV antibiotics within 60 minutes of beginning the procedure. The tourniquet was not utilized.  An anterior approach to the knee was performed utilizing a midvastus arthrotomy. A medial release was performed and the patellar fat pad was excised. Stryker navigation was used to cut the distal femur perpendicular to the mechanical axis. A freehand patellar resection was performed, and the patella was sized an prepared with 3 lug holes.  Nagivation was used to make a neutral proximal tibia  resection, taking 8 mm of bone from the less affected lateral side with 3 degrees of slope. The menisci were excised. A spacer block was placed, and the alignment and balance in extension were confirmed.   The distal femur was sized using the 3-degree external rotation guide referencing the posterior femoral cortex. The appropriate 4-in-1 cutting block was pinned into place. Rotation was checked using Whiteside's line, the epicondylar axis, and then confirmed with a spacer block in flexion. The remaining femoral cuts were performed, taking care to protect the MCL.  The tibia was sized and the trial tray was pinned into place. The remaining trail components were inserted. The knee was stable to varus and valgus stress through a full range of motion. The patella tracked centrally, and the PCL was well balanced. The trial components were removed, and the proximal tibial surface was prepared. Final components were impacted into place. The knee was tested for a final time and found to be well balanced.  The wound was copiously irrigated with normal saline with pulse lavage. Marcaine solution was injected into the periarticular soft tissue. The wound was closed in layers using #1 Vicryl and Stratafix for the fascia, 2-0 Vicryl for the subcutaneous fat, 2-0 Monocryl for the deep dermal layer, 3-0 running Monocryl subcuticular Stitch, and Dermabond for the skin. Once the glue was fully dried, an Aquacell Ag and compressive dressing were applied. Tthe patient was transported to the recovery room in stable condition. Sponge, needle, and instrument counts were correct at the end of the case x2. The patient tolerated the procedure well and there were no known complications.  Please note that a surgical assistant was a medical necessity for this procedure in order to perform it in a safe and expeditious manner. Surgical assistant was necessary  to retract the ligaments and vital neurovascular structures to prevent  injury to them and also necessary for proper positioning of the limb to allow for anatomic placement of the prosthesis.

## 2018-07-11 ENCOUNTER — Encounter (HOSPITAL_COMMUNITY): Payer: Self-pay | Admitting: Orthopedic Surgery

## 2018-07-11 LAB — CBC
HCT: 36.8 % (ref 36.0–46.0)
Hemoglobin: 12.1 g/dL (ref 12.0–15.0)
MCH: 34.1 pg — AB (ref 26.0–34.0)
MCHC: 32.9 g/dL (ref 30.0–36.0)
MCV: 103.7 fL — AB (ref 80.0–100.0)
NRBC: 0 % (ref 0.0–0.2)
PLATELETS: 192 10*3/uL (ref 150–400)
RBC: 3.55 MIL/uL — ABNORMAL LOW (ref 3.87–5.11)
RDW: 12 % (ref 11.5–15.5)
WBC: 7.5 10*3/uL (ref 4.0–10.5)

## 2018-07-11 LAB — BASIC METABOLIC PANEL
Anion gap: 9 (ref 5–15)
BUN: 9 mg/dL (ref 8–23)
CALCIUM: 8.8 mg/dL — AB (ref 8.9–10.3)
CO2: 25 mmol/L (ref 22–32)
CREATININE: 0.66 mg/dL (ref 0.44–1.00)
Chloride: 106 mmol/L (ref 98–111)
GFR calc Af Amer: >60 mL/min (ref >60)
GLUCOSE: 155 mg/dL — AB (ref 70–99)
Potassium: 3.7 mmol/L (ref 3.5–5.1)
Sodium: 140 mmol/L (ref 135–145)

## 2018-07-11 MED ORDER — DOCUSATE SODIUM 100 MG PO CAPS: 100.0000 mg | ORAL_CAPSULE | Freq: Two times a day (BID) | ORAL | 1 refills | Status: DC

## 2018-07-11 MED ORDER — SENNA 8.6 MG PO TABS: 1.0000 | ORAL_TABLET | Freq: Two times a day (BID) | ORAL | 0 refills | Status: DC

## 2018-07-11 MED ORDER — ASPIRIN 81 MG PO CHEW: 81.0000 mg | CHEWABLE_TABLET | Freq: Two times a day (BID) | ORAL | 1 refills | Status: DC

## 2018-07-11 MED ORDER — METHOCARBAMOL 500 MG PO TABS: 500.0000 mg | ORAL_TABLET | Freq: Four times a day (QID) | ORAL | 0 refills | Status: DC | PRN

## 2018-07-11 MED ORDER — HYDROCODONE-ACETAMINOPHEN 5-325 MG PO TABS: 1.0000 | ORAL_TABLET | ORAL | 0 refills | Status: DC | PRN

## 2018-07-11 MED ORDER — ONDANSETRON HCL 4 MG PO TABS: 4.0000 mg | ORAL_TABLET | Freq: Four times a day (QID) | ORAL | 0 refills | Status: DC | PRN

## 2018-07-11 NOTE — Evaluation (Signed)
Physical Therapy Evaluation Patient Details Name: Annette Murray MRN: [id number] DOB: 29-Apr-1956 Today's Date: 07/11/2018   History of Present Illness  Pt s/p L TKR and iwht hx of R TKR in 2013  Clinical Impression  Pt s/p L TKR and presents with decreased L LE strength/ROM and post op pain limiting functional mobility.  Pt should progress to dc home with family assist and plans OP PT starting 07/14/18.    Follow Up Recommendations Follow surgeon's recommendation for DC plan and follow-up therapies    Equipment Recommendations  None recommended by PT    Recommendations for Other Services       Precautions / Restrictions Precautions Precautions: Knee;Fall Restrictions Weight Bearing Restrictions: No LLE Weight Bearing: Weight bearing as tolerated      Mobility  Bed Mobility Overal bed mobility: Needs Assistance Bed Mobility: Supine to Sit     Supine to sit: Min guard     General bed mobility comments: min cues for sequence and use of R LE to self assist  Transfers Overall transfer level: Needs assistance Equipment used: Rolling walker (2 wheeled) Transfers: Sit to/from Stand Sit to Stand: Min guard         General transfer comment: cues for LE management and use of UEs to self assist  Ambulation/Gait Ambulation/Gait assistance: Min assist;Min guard Gait Distance (Feet): 120 Feet Assistive device: Rolling walker (2 wheeled) Gait Pattern/deviations: Step-to pattern;Step-through pattern;Decreased step length - right;Decreased step length - left;Shuffle;Trunk flexed Gait velocity: decr   General Gait Details: cues for sequence, posture and position from [employer]            Wheelchair Mobility    Modified Rankin (Stroke Patients Only)       Balance Overall balance assessment: Mild deficits observed, not formally tested                                           Pertinent Vitals/Pain Pain Assessment: 0-10 Pain Score: 4   Pain Location: L knee Pain Descriptors / Indicators: Aching;Sore Pain Intervention(s): Premedicated before session;Monitored during session;Limited activity within patient's tolerance;Ice applied    Home Living Family/patient expects to be discharged to:: Private residence Living Arrangements: Spouse/significant other Available Help at Discharge: Family Type of Home: House Home Access: Stairs to enter   Technical brewer of Steps: 1 Home Layout: One level Home Equipment: Environmental consultant - 2 wheels;Walker - 4 wheels;Cane - single point;Bedside commode;Crutches      Prior Function Level of Independence: Independent               Hand Dominance        Extremity/Trunk Assessment   Upper Extremity Assessment Upper Extremity Assessment: Overall WFL for tasks assessed    Lower Extremity Assessment Lower Extremity Assessment: LLE deficits/detail LLE Deficits / Details: 3/5 quads with IND SLR performed.  AAROM at knee -8 - 85    Cervical / Trunk Assessment Cervical / Trunk Assessment: Normal  Communication   Communication: No difficulties  Cognition Arousal/Alertness: Awake/alert Behavior During Therapy: WFL for tasks assessed/performed Overall Cognitive Status: Within Functional Limits for tasks assessed                                        General Comments      Exercises Total  Joint Exercises Ankle Circles/Pumps: AROM;Both;15 reps;Supine Quad Sets: AROM;Both;10 reps;Supine Heel Slides: AAROM;15 reps;Supine;Left Straight Leg Raises: AAROM;AROM;Left;15 reps;Supine   Assessment/Plan    PT Assessment Patient needs continued PT services  PT Problem List Decreased strength;Decreased range of motion;Decreased activity tolerance;Decreased mobility;Decreased knowledge of use of DME;Pain       PT Treatment Interventions DME instruction;Gait training;Stair training;Functional mobility training;Therapeutic activities;Therapeutic exercise;Patient/family  education    PT Goals (Current goals can be found in the Care Plan section)  Acute Rehab PT Goals Patient Stated Goal: walk without knee pain PT Goal Formulation: With patient Time For Goal Achievement: 07/18/18 Potential to Achieve Goals: Good    Frequency 7X/week   Barriers to discharge        Co-evaluation               AM-PAC PT "6 Clicks" Daily Activity  Outcome Measure Difficulty turning over in bed (including adjusting bedclothes, sheets and blankets)?: A Lot Difficulty moving from lying on back to sitting on the side of the bed? : A Lot Difficulty sitting down on and standing up from a chair with arms (e.g., wheelchair, bedside commode, etc,.)?: A Lot Help needed moving to and from a bed to chair (including a wheelchair)?: A Little Help needed walking in hospital room?: A Little Help needed climbing 3-5 steps with a railing? : A Little 6 Click Score: 15    End of Session Equipment Utilized During Treatment: Gait belt Activity Tolerance: Patient tolerated treatment well Patient left: in chair;with call bell/phone within reach Nurse Communication: Mobility status PT Visit Diagnosis: Difficulty in walking, not elsewhere classified (R26.2)    Time: 4098-1191 PT Time Calculation (min) (ACUTE ONLY): 34 min   Charges:   PT Evaluation $PT Eval Low Complexity: 1 Low PT Treatments $Therapeutic Exercise: 8-22 mins        Debe Coder PT Acute Rehabilitation Services Pager [phone] Office [phone]   Sigismund Cross 07/11/2018, 11:37 AM

## 2018-07-11 NOTE — Progress Notes (Signed)
Physical Therapy Treatment Patient Details Name: Annette Murray MRN: [id number] DOB: [birth year] Today's Date: 07/11/2018    History of Present Illness Pt s/p L TKR and iwht hx of R TKR in 2013    PT Comments    Pt very motivated and progressing well.  Spouse present and reviewed stairs and home therex program with written instruction provided.   Follow Up Recommendations  Follow surgeon's recommendation for DC plan and follow-up therapies     Equipment Recommendations  None recommended by PT    Recommendations for Other Services       Precautions / Restrictions Precautions Precautions: Knee;Fall Restrictions Weight Bearing Restrictions: No LLE Weight Bearing: Weight bearing as tolerated    Mobility  Bed Mobility Overal bed mobility: Needs Assistance Bed Mobility: Supine to Sit     Supine to sit: Min guard     General bed mobility comments: Pt up in chair and requests back to same  Transfers Overall transfer level: Needs assistance Equipment used: Rolling walker (2 wheeled) Transfers: Sit to/from Stand Sit to Stand: Min guard;Supervision         General transfer comment: cues for LE management and use of UEs to self assist  Ambulation/Gait Ambulation/Gait assistance: Min guard;Supervision Gait Distance (Feet): 120 Feet Assistive device: Rolling walker (2 wheeled) Gait Pattern/deviations: Step-to pattern;Step-through pattern;Decreased step length - right;Decreased step length - left;Shuffle;Trunk flexed Gait velocity: decr   General Gait Details: min cues for sequence, posture and position from RW   Stairs Stairs: Yes   Stair Management: No rails;Step to pattern;Backwards;Forwards;With walker Number of Stairs: 4 General stair comments: 2 single steps fwd and 2 single steps bkwd - cues for sequence and RW/foot placement   Wheelchair Mobility    Modified Rankin (Stroke Patients Only)       Balance Overall balance assessment: Mild deficits  observed, not formally tested                                          Cognition Arousal/Alertness: Awake/alert Behavior During Therapy: WFL for tasks assessed/performed Overall Cognitive Status: Within Functional Limits for tasks assessed                                        Exercises Total Joint Exercises Ankle Circles/Pumps: AROM;Both;15 reps;Supine Quad Sets: AROM;Both;10 reps;Supine Heel Slides: AAROM;15 reps;Supine;Left Straight Leg Raises: AAROM;AROM;Left;15 reps;Supine Long Arc Quad: AROM;Left;10 reps;Seated Knee Flexion: AROM;AAROM;Left;10 reps;Seated    General Comments        Pertinent Vitals/Pain Pain Assessment: 0-10 Pain Score: 4  Pain Location: L knee Pain Descriptors / Indicators: Aching;Sore Pain Intervention(s): Limited activity within patient's tolerance;Premedicated before session;Monitored during session;Ice applied    Home Living Family/patient expects to be discharged to:: Private residence Living Arrangements: Spouse/significant other Available Help at Discharge: Family Type of Home: House Home Access: Stairs to enter   Home Layout: One level Home Equipment: Environmental consultant - 2 wheels;Walker - 4 wheels;Cane - single point;Bedside commode;Crutches      Prior Function Level of Independence: Independent          PT Goals (current goals can now be found in the care plan section) Acute Rehab PT Goals Patient Stated Goal: walk without knee pain PT Goal Formulation: With patient Time For Goal Achievement: 07/18/18 Potential to Achieve Goals:  Good Progress towards PT goals: Progressing toward goals    Frequency    7X/week      PT Plan Current plan remains appropriate    Co-evaluation              AM-PAC PT "6 Clicks" Daily Activity  Outcome Measure  Difficulty turning over in bed (including adjusting bedclothes, sheets and blankets)?: A Lot Difficulty moving from lying on back to sitting on the side  of the bed? : A Lot Difficulty sitting down on and standing up from a chair with arms (e.g., wheelchair, bedside commode, etc,.)?: A Little Help needed moving to and from a bed to chair (including a wheelchair)?: A Little Help needed walking in hospital room?: A Little Help needed climbing 3-5 steps with a railing? : A Little 6 Click Score: 16    End of Session Equipment Utilized During Treatment: Gait belt Activity Tolerance: Patient tolerated treatment well Patient left: in chair;with call bell/phone within reach Nurse Communication: Mobility status PT Visit Diagnosis: Difficulty in walking, not elsewhere classified (R26.2)     Time: 1062-6948 PT Time Calculation (min) (ACUTE ONLY): 35 min  Charges:  $Gait Training: 8-22 mins $Therapeutic Exercise: 8-22 mins                     [place] Pager [phone] Office [phone]    Damaree Sargent 07/11/2018, 1:02 PM

## 2018-07-11 NOTE — Discharge Summary (Signed)
Physician Discharge Summary  Patient ID: Annette Murray MRN: [id number] DOB/AGE: 62-Feb-1957 62 y.o.  Admit date: 07/10/2018 Discharge date: 07/11/2018  Admission Diagnoses:  Osteoarthritis of left knee  Discharge Diagnoses:  Principal Problem:   Osteoarthritis of left knee   Past Medical History:  Diagnosis Date  . ADD (attention deficit disorder)   . Allergy    RHINITIS  . Arthritis   . Complication of anesthesia    itching   . FH: colonic polyps   . Heart murmur    MVP  . Herpes labialis   . Hypertension    borderline  . MVP (mitral valve prolapse)   . Pneumonia    hx of   . Vitiligo     Surgeries: Procedure(s): LEFT TOTAL KNEE ARTHROPLASTY WITH COMPUTER NAVIGATION on 07/10/2018   Consultants (if any):   Discharged Condition: Improved  Hospital Course: Annette Murray is an 62 y.o. female who was admitted 07/10/2018 with a diagnosis of Osteoarthritis of left knee and went to the operating room on 07/10/2018 and underwent the above named procedures.    She was given perioperative antibiotics:  Anti-infectives (From admission, onward)   Start     Dose/Rate Route Frequency Ordered Stop   07/11/18 1000  valACYclovir (VALTREX) tablet 500 mg  Status:  Discontinued     500 mg Oral Daily 07/10/18 1701 07/11/18 1736   07/10/18 1900  ceFAZolin (ANCEF) IVPB 2g/100 mL premix     2 g 200 mL/hr over 30 Minutes Intravenous Every 6 hours 07/10/18 1701 07/11/18 0031   07/10/18 1015  ceFAZolin (ANCEF) IVPB 2g/100 mL premix     2 g 200 mL/hr over 30 Minutes Intravenous On call to O.R. 07/10/18 1003 07/10/18 1321    .  She was given sequential compression devices, early ambulation, and ASA for DVT prophylaxis.  She benefited maximally from the hospital stay and there were no complications.    Recent vital signs:  Vitals:   07/11/18 0925 07/11/18 1329  BP: 118/62 115/70  Pulse: 74 70  Resp: 16 16  Temp: 98.3 F (36.8 C) 98.4 F (36.9 C)  SpO2: 99% 100%     Recent laboratory studies:  Lab Results  Component Value Date   HGB 12.1 07/11/2018   HGB 15.7 06/16/2018   HGB 14.5 05/20/2017   Lab Results  Component Value Date   WBC 7.5 07/11/2018   PLT 192 07/11/2018   Lab Results  Component Value Date   INR 1.03 04/28/2012   Lab Results  Component Value Date   NA 140 07/11/2018   K 3.7 07/11/2018   CL 106 07/11/2018   CO2 25 07/11/2018   BUN 9 07/11/2018   CREATININE 0.66 07/11/2018   GLUCOSE 155 (H) 07/11/2018    Discharge Medications:   Allergies as of 07/11/2018      Reactions   Codeine Itching   Tolerates with benadryl       Medication List    STOP taking these medications   acetaminophen 500 MG tablet Commonly known as:  TYLENOL   aspirin 81 MG tablet Replaced by:  aspirin 81 MG chewable tablet   hydroquinone 4 % cream     TAKE these medications   amoxicillin 500 MG capsule Commonly known as:  AMOXIL Take 2,000 mg by mouth See admin instructions. 1 hour prior to dental visit   amphetamine-dextroamphetamine 20 MG 24 hr capsule Commonly known as:  ADDERALL XR Take 1 capsule (20 mg total) by mouth every morning.  What changed:    when to take this  reasons to take this   aspirin 81 MG chewable tablet Chew 1 tablet (81 mg total) by mouth 2 (two) times daily. Replaces:  aspirin 81 MG tablet   diltiazem 120 MG 24 hr capsule Commonly known as:  CARDIZEM CD Take 1 capsule (120 mg total) by mouth daily.   docusate sodium 100 MG capsule Commonly known as:  COLACE Take 1 capsule (100 mg total) by mouth 2 (two) times daily.   fluticasone 50 MCG/ACT nasal spray Commonly known as:  FLONASE Place 1 spray into both nostrils at bedtime as needed for allergies or rhinitis.   hydrochlorothiazide 12.5 MG capsule Commonly known as:  MICROZIDE TAKE 1 CAPSULE BY MOUTH EVERY MORNING. What changed:    how much to take  how to take this  when to take this  additional instructions    HYDROcodone-acetaminophen 5-325 MG tablet Commonly known as:  NORCO/VICODIN Take 1-2 tablets by mouth every 4 (four) hours as needed for moderate pain (pain score 4-6).   methocarbamol 500 MG tablet Commonly known as:  ROBAXIN Take 1 tablet (500 mg total) by mouth every 6 (six) hours as needed for muscle spasms.   multivitamin with minerals Tabs tablet Take 1 tablet by mouth daily.   ondansetron 4 MG tablet Commonly known as:  ZOFRAN Take 1 tablet (4 mg total) by mouth every 6 (six) hours as needed for nausea.   Potassium 99 MG Tabs Take 99 mg by mouth daily.   psyllium 0.52 g capsule Commonly known as:  REGULOID Take 0.52 g by mouth daily as needed (constipation).   senna 8.6 MG Tabs tablet Commonly known as:  SENOKOT Take 1 tablet (8.6 mg total) by mouth 2 (two) times daily.   traZODone 50 MG tablet Commonly known as:  DESYREL TAKE 1 TABLET BY MOUTH AT BEDTIME What changed:    when to take this  reasons to take this   valACYclovir 500 MG tablet Commonly known as:  VALTREX TAKE 1 TABLET (500 MG TOTAL) BY MOUTH EVERY MORNING.   VITAMIN B-12 PO Take 1 capsule by mouth daily.       Diagnostic Studies: Dg Knee Left Port  Result Date: 07/10/2018 CLINICAL DATA:  Status post left total knee replacement - PACU images EXAM: PORTABLE LEFT KNEE - 1-2 VIEW COMPARISON:  None. FINDINGS: The patient has undergone total knee arthroplasty. The hardware is well-seated. There is no dislocation. Soft tissue and joint space gas are present following surgery. IMPRESSION: Status post total knee arthroplasty.  No adverse features. Electronically Signed   By: Nolon Nations M.D.   On: 07/10/2018 15:29    Disposition:    Discharge Instructions    Call MD / Call 911   Complete by:  As directed    If you experience chest pain or shortness of breath, CALL 911 and be transported to the hospital emergency room.  If you develope a fever above 101 F, pus (white drainage) or increased  drainage or redness at the wound, or calf pain, call your surgeon's office.   Constipation Prevention   Complete by:  As directed    Drink plenty of fluids.  Prune juice may be helpful.  You may use a stool softener, such as Colace (over the counter) 100 mg twice a day.  Use MiraLax (over the counter) for constipation as needed.   Diet - low sodium heart healthy   Complete by:  As directed  Do not put a pillow under the knee. Place it under the heel.   Complete by:  As directed    Driving restrictions   Complete by:  As directed    No driving for 6 weeks   Increase activity slowly as tolerated   Complete by:  As directed    Lifting restrictions   Complete by:  As directed    No lifting for 6 weeks   TED hose   Complete by:  As directed    Use stockings (TED hose) for 2 weeks on both leg(s).  You may remove them at night for sleeping.      Follow-up Information    Venice Liz, Aaron Edelman, MD. Schedule an appointment as soon as possible for a visit in 2 weeks.   Specialty:  Orthopedic Surgery Why:  For wound re-check Contact information: [address] [place] [address] [phone]            Signed: Hilton Cork Nazanin Kinner 07/11/2018, 8:11 PM

## 2018-07-11 NOTE — Progress Notes (Addendum)
    Subjective:  Patient reports pain as mild to moderate.  Denies N/V/CP/SOB. No c/o.  Objective:   VITALS:   Vitals:   07/10/18 1956 07/10/18 2100 07/11/18 0203 07/11/18 0518  BP: 103/65 103/61 (!) 99/53 115/68  Pulse: 66 (!) 59 61 (!) 58  Resp: 15 16 19 18   Temp: 97.9 F (36.6 C) 97.9 F (36.6 C) 97.8 F (36.6 C) 98.4 F (36.9 C)  TempSrc: Oral Oral Oral Oral  SpO2: 100% 100% 100% 100%    NAD ABD soft Sensation intact distally Intact pulses distally Dorsiflexion/Plantar flexion intact Incision: dressing C/D/I Compartment soft   Lab Results  Component Value Date   WBC 7.5 07/11/2018   HGB 12.1 07/11/2018   HCT 36.8 07/11/2018   MCV 103.7 (H) 07/11/2018   PLT 192 07/11/2018   BMET    Component Value Date/Time   NA 140 07/11/2018 0530   NA 137 06/16/2018 1101   K 3.7 07/11/2018 0530   CL 106 07/11/2018 0530   CO2 25 07/11/2018 0530   GLUCOSE 155 (H) 07/11/2018 0530   BUN 9 07/11/2018 0530   BUN 10 06/16/2018 1101   CREATININE 0.66 07/11/2018 0530   CREATININE 0.83 05/20/2017 1435   CALCIUM 8.8 (L) 07/11/2018 0530   GFRNONAA >60 07/11/2018 0530   GFRAA >60 07/11/2018 0530    Patient's anticipated LOS is less than 2 midnights, meeting these requirements: - Younger than 66 - Lives within 1 hour of care - Has a competent adult at home to recover with post-op recover - NO history of  - Chronic pain requiring opiods  - Diabetes  - Coronary Artery Disease  - Heart failure  - Heart attack  - Stroke  - DVT/VTE  - Cardiac arrhythmia  - Respiratory Failure/COPD  - Renal failure  - Anemia  - Advanced Liver disease       Assessment/Plan: 1 Day Post-Op   Principal Problem:   Osteoarthritis of left knee   WBAT with walker DVT ppx: Aspirin, SCDs, TEDS PO pain control PT/OT Dispo: D/C home with outpatient PT, today vs tomorrow   Hilton Cork Tilda Samudio 07/11/2018, 7:57 AM   Rod Can, MD Cell [phone]

## 2018-07-11 NOTE — Progress Notes (Signed)
Patient discharged to home w/ family. Given all belongings, instructions, prescriptions. Equipment at home. Patient and family verbalized understanding of all instructions. Escorted to pov via w/c.

## 2018-07-14 DIAGNOSIS — M25562 Pain in left knee: Secondary | ICD-10-CM | POA: Diagnosis not present

## 2018-07-16 DIAGNOSIS — M25562 Pain in left knee: Secondary | ICD-10-CM | POA: Diagnosis not present

## 2018-07-18 DIAGNOSIS — M25562 Pain in left knee: Secondary | ICD-10-CM | POA: Diagnosis not present

## 2018-07-21 DIAGNOSIS — M25562 Pain in left knee: Secondary | ICD-10-CM | POA: Diagnosis not present

## 2018-07-23 DIAGNOSIS — M25562 Pain in left knee: Secondary | ICD-10-CM | POA: Diagnosis not present

## 2018-07-25 DIAGNOSIS — Z471 Aftercare following joint replacement surgery: Secondary | ICD-10-CM | POA: Diagnosis not present

## 2018-07-25 DIAGNOSIS — Z96652 Presence of left artificial knee joint: Secondary | ICD-10-CM | POA: Diagnosis not present

## 2018-07-25 DIAGNOSIS — M25562 Pain in left knee: Secondary | ICD-10-CM | POA: Diagnosis not present

## 2018-07-29 DIAGNOSIS — M25562 Pain in left knee: Secondary | ICD-10-CM | POA: Diagnosis not present

## 2018-07-31 DIAGNOSIS — M25562 Pain in left knee: Secondary | ICD-10-CM | POA: Diagnosis not present

## 2018-08-05 DIAGNOSIS — M25562 Pain in left knee: Secondary | ICD-10-CM | POA: Diagnosis not present

## 2018-08-07 DIAGNOSIS — M25562 Pain in left knee: Secondary | ICD-10-CM | POA: Diagnosis not present

## 2018-08-11 DIAGNOSIS — M25562 Pain in left knee: Secondary | ICD-10-CM | POA: Diagnosis not present

## 2018-08-13 ENCOUNTER — Ambulatory Visit: Payer: BLUE CROSS/BLUE SHIELD | Admitting: Cardiovascular Disease

## 2018-08-13 DIAGNOSIS — M25562 Pain in left knee: Secondary | ICD-10-CM | POA: Diagnosis not present

## 2018-08-18 DIAGNOSIS — M25562 Pain in left knee: Secondary | ICD-10-CM | POA: Diagnosis not present

## 2018-08-21 ENCOUNTER — Telehealth: Payer: Self-pay | Admitting: Family Medicine

## 2018-08-21 DIAGNOSIS — F901 Attention-deficit hyperactivity disorder, predominantly hyperactive type: Secondary | ICD-10-CM

## 2018-08-21 MED ORDER — AMPHETAMINE-DEXTROAMPHET ER 20 MG PO CP24: 20.0000 mg | ORAL_CAPSULE | ORAL | 0 refills | Status: DC

## 2018-08-21 NOTE — Telephone Encounter (Signed)
Pt called and is requesting a refill on her adderall pt would like it sent to the [id number] - [place], [place] - [address]

## 2018-08-22 DIAGNOSIS — M25562 Pain in left knee: Secondary | ICD-10-CM | POA: Diagnosis not present

## 2018-08-25 ENCOUNTER — Other Ambulatory Visit: Payer: Self-pay | Admitting: Family Medicine

## 2018-08-25 DIAGNOSIS — Z96652 Presence of left artificial knee joint: Secondary | ICD-10-CM | POA: Diagnosis not present

## 2018-08-25 DIAGNOSIS — Z471 Aftercare following joint replacement surgery: Secondary | ICD-10-CM | POA: Diagnosis not present

## 2018-08-25 DIAGNOSIS — L814 Other melanin hyperpigmentation: Secondary | ICD-10-CM

## 2018-09-02 ENCOUNTER — Other Ambulatory Visit: Payer: Self-pay | Admitting: Family Medicine

## 2018-09-02 DIAGNOSIS — G479 Sleep disorder, unspecified: Secondary | ICD-10-CM

## 2018-09-02 NOTE — Telephone Encounter (Signed)
[facility] trazod[facility]. Please advise KH 

## 2018-09-26 ENCOUNTER — Telehealth: Payer: Self-pay | Admitting: Family Medicine

## 2018-09-26 ENCOUNTER — Other Ambulatory Visit: Payer: Self-pay | Admitting: Family Medicine

## 2018-09-26 DIAGNOSIS — F901 Attention-deficit hyperactivity disorder, predominantly hyperactive type: Secondary | ICD-10-CM

## 2018-09-26 MED ORDER — AMPHETAMINE-DEXTROAMPHET ER 20 MG PO CP24: 20.0000 mg | ORAL_CAPSULE | ORAL | 0 refills | Status: DC

## 2018-09-26 NOTE — Telephone Encounter (Signed)
[facility] is requesting to fill pt valtrex. Please advise KH 

## 2018-09-26 NOTE — Telephone Encounter (Signed)
[facility]

## 2018-11-29 ENCOUNTER — Other Ambulatory Visit: Payer: Self-pay | Admitting: Family Medicine

## 2018-11-29 DIAGNOSIS — G479 Sleep disorder, unspecified: Secondary | ICD-10-CM

## 2018-12-01 NOTE — Telephone Encounter (Signed)
[facility] tra[facility]. Please advise KH °

## 2018-12-12 ENCOUNTER — Telehealth: Payer: Self-pay | Admitting: Family Medicine

## 2018-12-12 DIAGNOSIS — F901 Attention-deficit hyperactivity disorder, predominantly hyperactive type: Secondary | ICD-10-CM

## 2018-12-12 MED ORDER — AMPHETAMINE-DEXTROAMPHET ER 20 MG PO CP24: 20.0000 mg | ORAL_CAPSULE | ORAL | 0 refills | Status: DC

## 2018-12-12 NOTE — Telephone Encounter (Signed)
Pt left messages and needs refill on [facility]derall [facility] in [place]

## 2019-02-22 ENCOUNTER — Other Ambulatory Visit: Payer: Self-pay | Admitting: Family Medicine

## 2019-02-22 DIAGNOSIS — G479 Sleep disorder, unspecified: Secondary | ICD-10-CM

## 2019-02-24 NOTE — Telephone Encounter (Signed)
[facility] trazod[facility]. Please advise KH 

## 2019-02-27 DIAGNOSIS — Z471 Aftercare following joint replacement surgery: Secondary | ICD-10-CM | POA: Diagnosis not present

## 2019-02-27 DIAGNOSIS — Z96652 Presence of left artificial knee joint: Secondary | ICD-10-CM | POA: Diagnosis not present

## 2019-03-03 ENCOUNTER — Telehealth: Payer: Self-pay | Admitting: Family Medicine

## 2019-03-03 DIAGNOSIS — F901 Attention-deficit hyperactivity disorder, predominantly hyperactive type: Secondary | ICD-10-CM

## 2019-03-03 MED ORDER — AMPHETAMINE-DEXTROAMPHET ER 20 MG PO CP24: 20.0000 mg | ORAL_CAPSULE | Freq: Every day | ORAL | 0 refills | Status: DC

## 2019-03-03 MED ORDER — AMPHETAMINE-DEXTROAMPHET ER 20 MG PO CP24: 20.0000 mg | ORAL_CAPSULE | ORAL | 0 refills | Status: DC

## 2019-03-03 NOTE — Telephone Encounter (Signed)
Pt left voicemail that she needs refill on her adderal XR 20  She said she normally gets 3 months at a time but pharmacy states last rx for end of March was only for 30 days and she hasn't needed it until now  [facility]

## 2019-03-05 DIAGNOSIS — Z6821 Body mass index (BMI) 21.0-21.9, adult: Secondary | ICD-10-CM | POA: Diagnosis not present

## 2019-03-05 DIAGNOSIS — Z01419 Encounter for gynecological examination (general) (routine) without abnormal findings: Secondary | ICD-10-CM | POA: Diagnosis not present

## 2019-04-12 ENCOUNTER — Other Ambulatory Visit: Payer: Self-pay | Admitting: Family Medicine

## 2019-04-12 DIAGNOSIS — G479 Sleep disorder, unspecified: Secondary | ICD-10-CM

## 2019-04-13 NOTE — Telephone Encounter (Signed)
[facility] trazod[facility]. Please advise KH 

## 2019-06-19 ENCOUNTER — Other Ambulatory Visit: Payer: Self-pay | Admitting: Family Medicine

## 2019-06-19 DIAGNOSIS — I10 Essential (primary) hypertension: Secondary | ICD-10-CM

## 2019-06-19 DIAGNOSIS — I7300 Raynaud's syndrome without gangrene: Secondary | ICD-10-CM

## 2019-06-19 NOTE — Telephone Encounter (Signed)
LVM for pt to find out if pt is still under our care and if so to schedule a CPE or med check appt. [place]

## 2019-06-30 ENCOUNTER — Telehealth: Payer: Self-pay | Admitting: Family Medicine

## 2019-06-30 DIAGNOSIS — G479 Sleep disorder, unspecified: Secondary | ICD-10-CM

## 2019-06-30 DIAGNOSIS — F901 Attention-deficit hyperactivity disorder, predominantly hyperactive type: Secondary | ICD-10-CM

## 2019-06-30 MED ORDER — AMPHETAMINE-DEXTROAMPHET ER 20 MG PO CP24: 20.0000 mg | ORAL_CAPSULE | ORAL | 0 refills | Status: DC

## 2019-06-30 MED ORDER — TRAZODONE HCL 50 MG PO TABS: ORAL_TABLET | ORAL | 0 refills | Status: DC

## 2019-06-30 NOTE — Telephone Encounter (Signed)
Pt called and left message on voice mail, needing refill on Trazadone and Adderall.  She has made appt for October.  [facility]  [place]    Pt ph [phone]

## 2019-07-03 ENCOUNTER — Other Ambulatory Visit: Payer: Self-pay | Admitting: Family Medicine

## 2019-07-03 DIAGNOSIS — I10 Essential (primary) hypertension: Secondary | ICD-10-CM

## 2019-07-03 DIAGNOSIS — I7300 Raynaud's syndrome without gangrene: Secondary | ICD-10-CM

## 2019-07-07 ENCOUNTER — Encounter: Payer: Self-pay | Admitting: Family Medicine

## 2019-07-07 ENCOUNTER — Other Ambulatory Visit: Payer: Self-pay

## 2019-07-07 ENCOUNTER — Ambulatory Visit (INDEPENDENT_AMBULATORY_CARE_PROVIDER_SITE_OTHER): Payer: BC Managed Care – PPO | Admitting: Family Medicine

## 2019-07-07 VITALS — BP 122/84 | HR 91 | Temp 97.7°F | Wt 120.2 lb

## 2019-07-07 DIAGNOSIS — I10 Essential (primary) hypertension: Secondary | ICD-10-CM

## 2019-07-07 DIAGNOSIS — Z23 Encounter for immunization: Secondary | ICD-10-CM | POA: Diagnosis not present

## 2019-07-07 DIAGNOSIS — G479 Sleep disorder, unspecified: Secondary | ICD-10-CM

## 2019-07-07 DIAGNOSIS — B001 Herpesviral vesicular dermatitis: Secondary | ICD-10-CM

## 2019-07-07 DIAGNOSIS — I7300 Raynaud's syndrome without gangrene: Secondary | ICD-10-CM

## 2019-07-07 DIAGNOSIS — I059 Rheumatic mitral valve disease, unspecified: Secondary | ICD-10-CM | POA: Diagnosis not present

## 2019-07-07 DIAGNOSIS — F901 Attention-deficit hyperactivity disorder, predominantly hyperactive type: Secondary | ICD-10-CM

## 2019-07-07 DIAGNOSIS — E785 Hyperlipidemia, unspecified: Secondary | ICD-10-CM

## 2019-07-07 DIAGNOSIS — R899 Unspecified abnormal finding in specimens from other organs, systems and tissues: Secondary | ICD-10-CM

## 2019-07-07 DIAGNOSIS — Z96653 Presence of artificial knee joint, bilateral: Secondary | ICD-10-CM

## 2019-07-07 DIAGNOSIS — Z800 Family history of malignant neoplasm of digestive organs: Secondary | ICD-10-CM

## 2019-07-07 MED ORDER — HYDROCHLOROTHIAZIDE 12.5 MG PO CAPS: 12.5000 mg | ORAL_CAPSULE | Freq: Every day | ORAL | 3 refills | Status: DC

## 2019-07-07 MED ORDER — VALACYCLOVIR HCL 500 MG PO TABS: ORAL_TABLET | ORAL | 3 refills | Status: DC

## 2019-07-07 MED ORDER — DILTIAZEM HCL ER COATED BEADS 120 MG PO CP24: ORAL_CAPSULE | ORAL | 3 refills | Status: DC

## 2019-07-07 MED ORDER — TRAZODONE HCL 50 MG PO TABS: ORAL_TABLET | ORAL | 0 refills | Status: DC

## 2019-07-07 NOTE — Progress Notes (Signed)
   Subjective:    Patient ID: Annette Murray, female    DOB: 10-20-1955, 63 y.o.   MRN: [id number]  HPI She is here for an interval evaluation.  She continues to do quite nicely on her Adderall for her underlying ADD and would like a refill on that.  She is also taking diltiazem and HCTZ for both with help with blood pressure .  She also has a history of mitral valve prolapse and does follow-up regularly with cardiology.  She has a previous history of hyperlipidemia.  She does follow regularly with cardiology.  She also gets seen regularly by GI for her underlying colonic polyps.  She is now on a 5-year cycle.  She continues on Valtrex for her underlying herpes labialis.  She has had both knees replaced and seems to be doing quite nicely on that.  She continues on trazodone to help with her sleep disturbance.  She has been on that for quite some time.  Her Raynaud's disease seems to be under good control with the channel blocker.   Review of Systems     Objective:   Physical Exam Alert and in no distress. Tympanic membranes and canals are normal. Pharyngeal area is normal. Neck is supple without adenopathy or thyromegaly. Cardiac exam shows a regular sinus rhythm without murmurs or gallops. Lungs are clear to auscultation.        Assessment & Plan:  ADHD (attention deficit hyperactivity disorder), predominantly hyperactive impulsive type  Needs flu shot - Plan: Flu Vaccine QUAD 36+ mos IM  Mitral valve disorder  Herpes labialis - Plan: valACYclovir (VALTREX) 500 MG tablet  Status post total bilateral knee replacement  Essential hypertension - Plan: CBC with Differential/Platelet, Comprehensive metabolic panel, hydrochlorothiazide (MICROZIDE) 12.5 MG capsule, diltiazem (CARDIZEM CD) 120 MG 24 hr capsule  Sleep disturbance - Plan: traZODone (DESYREL) 50 MG tablet  Raynaud's disease without gangrene - Plan: diltiazem (CARDIZEM CD) 120 MG 24 hr capsule  Family history of colon cancer   Hyperlipidemia LDL goal <100 - Plan: Lipid panel  Continue on her present medication regimen.

## 2019-07-08 LAB — COMPREHENSIVE METABOLIC PANEL
ALT: 24 IU/L (ref 0–32)
AST: 33 IU/L (ref 0–40)
Albumin/Globulin Ratio: 2.3 — ABNORMAL HIGH (ref 1.2–2.2)
Albumin: 5.1 g/dL — ABNORMAL HIGH (ref 3.8–4.8)
Alkaline Phosphatase: 115 IU/L (ref 39–117)
BUN/Creatinine Ratio: 12 (ref 12–28)
BUN: 10 mg/dL (ref 8–27)
Bilirubin Total: 0.6 mg/dL (ref 0.0–1.2)
CO2: 26 mmol/L (ref 20–29)
Calcium: 10.6 mg/dL — ABNORMAL HIGH (ref 8.7–10.3)
Chloride: 96 mmol/L (ref 96–106)
Creatinine, Ser: 0.85 mg/dL (ref 0.57–1.00)
GFR calc Af Amer: 84 mL/min/{1.73_m2} (ref >59)
GFR calc non Af Amer: 73 mL/min/{1.73_m2} (ref >59)
Globulin, Total: 2.2 g/dL (ref 1.5–4.5)
Glucose: 94 mg/dL (ref 65–99)
Potassium: 5.2 mmol/L (ref 3.5–5.2)
Sodium: 138 mmol/L (ref 134–144)
Total Protein: 7.3 g/dL (ref 6.0–8.5)

## 2019-07-08 LAB — CBC WITH DIFFERENTIAL/PLATELET
Basophils Absolute: 0 10*3/uL (ref 0.0–0.2)
Basos: 0 %
EOS (ABSOLUTE): 0 10*3/uL (ref 0.0–0.4)
Eos: 1 %
Hematocrit: 46 % (ref 34.0–46.6)
Hemoglobin: 16.2 g/dL — ABNORMAL HIGH (ref 11.1–15.9)
Immature Grans (Abs): 0 10*3/uL (ref 0.0–0.1)
Immature Granulocytes: 0 %
Lymphocytes Absolute: 2 10*3/uL (ref 0.7–3.1)
Lymphs: 38 %
MCH: 35.1 pg — ABNORMAL HIGH (ref 26.6–33.0)
MCHC: 35.2 g/dL (ref 31.5–35.7)
MCV: 100 fL — ABNORMAL HIGH (ref 79–97)
Monocytes Absolute: 0.4 10*3/uL (ref 0.1–0.9)
Monocytes: 8 %
Neutrophils Absolute: 2.7 10*3/uL (ref 1.4–7.0)
Neutrophils: 53 %
Platelets: 250 10*3/uL (ref 150–450)
RBC: 4.61 x10E6/uL (ref 3.77–5.28)
RDW: 11.7 % (ref 11.7–15.4)
WBC: 5.1 10*3/uL (ref 3.4–10.8)

## 2019-07-08 LAB — LIPID PANEL
Chol/HDL Ratio: 2.7 ratio (ref 0.0–4.4)
Cholesterol, Total: 319 mg/dL — ABNORMAL HIGH (ref 100–199)
HDL: 117 mg/dL (ref >39)
LDL Chol Calc (NIH): 162 mg/dL — ABNORMAL HIGH (ref 0–99)
Triglycerides: 226 mg/dL — ABNORMAL HIGH (ref 0–149)
VLDL Cholesterol Cal: 40 mg/dL (ref 5–40)

## 2019-07-08 NOTE — Addendum Note (Signed)
Addended by: Denita Lung on: 07/08/2019 08:45 AM   Modules accepted: Orders

## 2019-07-24 ENCOUNTER — Other Ambulatory Visit: Payer: Self-pay

## 2019-07-24 ENCOUNTER — Encounter: Payer: Self-pay | Admitting: Family Medicine

## 2019-07-27 ENCOUNTER — Other Ambulatory Visit: Payer: Self-pay

## 2019-07-27 DIAGNOSIS — Z20822 Contact with and (suspected) exposure to covid-19: Secondary | ICD-10-CM

## 2019-07-27 DIAGNOSIS — Z20828 Contact with and (suspected) exposure to other viral communicable diseases: Secondary | ICD-10-CM | POA: Diagnosis not present

## 2019-07-28 LAB — NOVEL CORONAVIRUS, NAA: SARS-CoV-2, NAA: NOT DETECTED

## 2019-08-07 ENCOUNTER — Other Ambulatory Visit: Payer: Self-pay

## 2019-08-07 ENCOUNTER — Other Ambulatory Visit (INDEPENDENT_AMBULATORY_CARE_PROVIDER_SITE_OTHER): Payer: BC Managed Care – PPO

## 2019-08-07 DIAGNOSIS — R899 Unspecified abnormal finding in specimens from other organs, systems and tissues: Secondary | ICD-10-CM

## 2019-08-07 DIAGNOSIS — R7309 Other abnormal glucose: Secondary | ICD-10-CM | POA: Diagnosis not present

## 2019-08-08 LAB — CBC WITH DIFFERENTIAL/PLATELET
Basophils Absolute: 0 10*3/uL (ref 0.0–0.2)
Basos: 0 %
EOS (ABSOLUTE): 0 10*3/uL (ref 0.0–0.4)
Eos: 1 %
Hematocrit: 42 % (ref 34.0–46.6)
Hemoglobin: 14.9 g/dL (ref 11.1–15.9)
Immature Grans (Abs): 0 10*3/uL (ref 0.0–0.1)
Immature Granulocytes: 0 %
Lymphocytes Absolute: 2.1 10*3/uL (ref 0.7–3.1)
Lymphs: 45 %
MCH: 34.7 pg — ABNORMAL HIGH (ref 26.6–33.0)
MCHC: 35.5 g/dL (ref 31.5–35.7)
MCV: 98 fL — ABNORMAL HIGH (ref 79–97)
Monocytes Absolute: 0.4 10*3/uL (ref 0.1–0.9)
Monocytes: 8 %
Neutrophils Absolute: 2.2 10*3/uL (ref 1.4–7.0)
Neutrophils: 46 %
Platelets: 230 10*3/uL (ref 150–450)
RBC: 4.29 x10E6/uL (ref 3.77–5.28)
RDW: 11.3 % — ABNORMAL LOW (ref 11.7–15.4)
WBC: 4.8 10*3/uL (ref 3.4–10.8)

## 2019-08-08 LAB — COMPREHENSIVE METABOLIC PANEL
ALT: 18 IU/L (ref 0–32)
AST: 26 IU/L (ref 0–40)
Albumin/Globulin Ratio: 2 (ref 1.2–2.2)
Albumin: 4.7 g/dL (ref 3.8–4.8)
Alkaline Phosphatase: 111 IU/L (ref 39–117)
BUN/Creatinine Ratio: 12 (ref 12–28)
BUN: 10 mg/dL (ref 8–27)
Bilirubin Total: 0.7 mg/dL (ref 0.0–1.2)
CO2: 27 mmol/L (ref 20–29)
Calcium: 10.3 mg/dL (ref 8.7–10.3)
Chloride: 98 mmol/L (ref 96–106)
Creatinine, Ser: 0.86 mg/dL (ref 0.57–1.00)
GFR calc Af Amer: 83 mL/min/{1.73_m2} (ref >59)
GFR calc non Af Amer: 72 mL/min/{1.73_m2} (ref >59)
Globulin, Total: 2.3 g/dL (ref 1.5–4.5)
Glucose: 113 mg/dL — ABNORMAL HIGH (ref 65–99)
Potassium: 4.4 mmol/L (ref 3.5–5.2)
Sodium: 140 mmol/L (ref 134–144)
Total Protein: 7 g/dL (ref 6.0–8.5)

## 2019-08-10 ENCOUNTER — Telehealth: Payer: Self-pay | Admitting: Family Medicine

## 2019-08-10 DIAGNOSIS — F901 Attention-deficit hyperactivity disorder, predominantly hyperactive type: Secondary | ICD-10-CM

## 2019-08-10 MED ORDER — AMPHETAMINE-DEXTROAMPHET ER 20 MG PO CP24: 20.0000 mg | ORAL_CAPSULE | ORAL | 0 refills | Status: DC

## 2019-08-10 MED ORDER — AMPHETAMINE-DEXTROAMPHET ER 20 MG PO CP24: 20.0000 mg | ORAL_CAPSULE | Freq: Every day | ORAL | 0 refills | Status: DC

## 2019-08-10 NOTE — Telephone Encounter (Signed)
Pt called and left VM states she needs refill on her adderall XR pt [id number] [place], [place] - [address]

## 2019-08-13 LAB — SPECIMEN STATUS REPORT

## 2019-08-13 LAB — HGB A1C W/O EAG: Hgb A1c MFr Bld: 4.7 % — ABNORMAL LOW (ref 4.8–5.6)

## 2019-08-24 DIAGNOSIS — L821 Other seborrheic keratosis: Secondary | ICD-10-CM | POA: Diagnosis not present

## 2019-08-24 DIAGNOSIS — Z23 Encounter for immunization: Secondary | ICD-10-CM | POA: Diagnosis not present

## 2019-08-24 DIAGNOSIS — L810 Postinflammatory hyperpigmentation: Secondary | ICD-10-CM | POA: Diagnosis not present

## 2019-09-21 ENCOUNTER — Telehealth: Payer: Self-pay

## 2019-09-21 NOTE — Telephone Encounter (Signed)
A prescription was written for December 9 and January 9 so it should be in their system

## 2019-09-21 NOTE — Telephone Encounter (Signed)
Called pt and advise of script being at the pharmacy . [place]

## 2019-09-21 NOTE — Telephone Encounter (Signed)
Received fax from [facility] for a refill on pt. Adderall 20mg  last apt. 07/07/19

## 2019-10-04 ENCOUNTER — Other Ambulatory Visit: Payer: Self-pay | Admitting: Family Medicine

## 2019-10-04 DIAGNOSIS — G479 Sleep disorder, unspecified: Secondary | ICD-10-CM

## 2019-10-05 NOTE — Telephone Encounter (Signed)
[facility] trazod[facility]. Please advise KH 

## 2019-11-25 ENCOUNTER — Ambulatory Visit: Payer: BLUE CROSS/BLUE SHIELD

## 2019-11-25 DIAGNOSIS — Z20828 Contact with and (suspected) exposure to other viral communicable diseases: Secondary | ICD-10-CM | POA: Diagnosis not present

## 2019-11-25 DIAGNOSIS — U071 COVID-19: Secondary | ICD-10-CM | POA: Diagnosis not present

## 2019-11-25 DIAGNOSIS — Z03818 Encounter for observation for suspected exposure to other biological agents ruled out: Secondary | ICD-10-CM | POA: Diagnosis not present

## 2019-12-02 ENCOUNTER — Encounter (HOSPITAL_COMMUNITY): Payer: Self-pay | Admitting: Family Medicine

## 2019-12-02 ENCOUNTER — Encounter: Payer: Self-pay | Admitting: Family Medicine

## 2019-12-02 ENCOUNTER — Ambulatory Visit (HOSPITAL_COMMUNITY)
Admission: RE | Admit: 2019-12-02 | Discharge: 2019-12-02 | Disposition: A | Discharge status: Home / Self Care | Payer: HRSA Program | Source: Ambulatory Visit | Attending: Pulmonary Disease | Admitting: Pulmonary Disease

## 2019-12-02 ENCOUNTER — Other Ambulatory Visit: Payer: Self-pay | Admitting: Nurse Practitioner

## 2019-12-02 DIAGNOSIS — U071 COVID-19: Secondary | ICD-10-CM | POA: Diagnosis not present

## 2019-12-02 DIAGNOSIS — I10 Essential (primary) hypertension: Secondary | ICD-10-CM | POA: Insufficient documentation

## 2019-12-02 MED ORDER — METHYLPREDNISOLONE SODIUM SUCC 125 MG IJ SOLR: 125.0000 mg | Freq: Once | INTRAMUSCULAR | Status: DC | PRN

## 2019-12-02 MED ORDER — EPINEPHRINE 0.3 MG/0.3ML IJ SOAJ: 0.3000 mg | Freq: Once | INTRAMUSCULAR | Status: DC | PRN

## 2019-12-02 MED ORDER — ALBUTEROL SULFATE HFA 108 (90 BASE) MCG/ACT IN AERS: 2.0000 | INHALATION_SPRAY | Freq: Once | RESPIRATORY_TRACT | Status: DC | PRN

## 2019-12-02 MED ORDER — SODIUM CHLORIDE 0.9 % IV SOLN
700.0000 mg | Freq: Once | INTRAVENOUS | Status: AC
  Administered 2019-12-02: 700 mg via INTRAVENOUS
  Filled 2019-12-02: qty 20

## 2019-12-02 MED ORDER — DIPHENHYDRAMINE HCL 50 MG/ML IJ SOLN: 50.0000 mg | Freq: Once | INTRAMUSCULAR | Status: DC | PRN

## 2019-12-02 MED ORDER — FAMOTIDINE IN NACL 20-0.9 MG/50ML-% IV SOLN: 20.0000 mg | Freq: Once | INTRAVENOUS | Status: DC | PRN

## 2019-12-02 MED ORDER — SODIUM CHLORIDE 0.9 % IV SOLN: INTRAVENOUS | Status: DC | PRN

## 2019-12-02 NOTE — Progress Notes (Signed)
  Diagnosis: COVID-19  Physician: Dr. Joya Gaskins   Procedure: [facility]fusion [facility] Med: bamlanivimab infusion - Provided patient with bamlanimivab fact sheet for patients, parents and caregivers prior to infusion.  Complications: No immediate complications noted.  Discharge: Discharged home   Annette Murray, Annette Murray 12/02/2019

## 2019-12-02 NOTE — Discharge Instructions (Signed)

## 2019-12-02 NOTE — Progress Notes (Signed)
  I connected by phone with Ancil Linsey on 12/02/2019 at 9:46 AM to discuss the potential use of an new treatment for mild to moderate COVID-19 viral infection in non-hospitalized patients.  This patient is a 64 y.o. female that meets the FDA criteria for Emergency Use Authorization of bamlanivimab or casirivimab\imdevimab.  Has a (+) direct SARS-CoV-2 viral test result  Has mild or moderate COVID-19   Is ? 64 years of age and weighs ? 40 kg  Is NOT hospitalized due to COVID-19  Is NOT requiring oxygen therapy or requiring an increase in baseline oxygen flow rate due to COVID-19  Is within 10 days of symptom onset  Has at least one of the high risk factor(s) for progression to severe COVID-19 and/or hospitalization as defined in EUA.  Specific high risk criteria : Hypertension   I have spoken and communicated the following to the patient or parent/caregiver:  1. FDA has authorized the emergency use of bamlanivimab and casirivimab\imdevimab for the treatment of mild to moderate COVID-19 in adults and pediatric patients with positive results of direct SARS-CoV-2 viral testing who are 10 years of age and older weighing at least 40 kg, and who are at high risk for progressing to severe COVID-19 and/or hospitalization.  2. The significant known and potential risks and benefits of bamlanivimab and casirivimab\imdevimab, and the extent to which such potential risks and benefits are unknown.  3. Information on available alternative treatments and the risks and benefits of those alternatives, including clinical trials.  4. Patients treated with bamlanivimab and casirivimab\imdevimab should continue to self-isolate and use infection control measures (e.g., wear mask, isolate, social distance, avoid sharing personal items, clean and disinfect "high touch" surfaces, and frequent handwashing) according to CDC guidelines.   5. The patient or parent/caregiver has the option to accept or refuse  bamlanivimab or casirivimab\imdevimab .  After reviewing this information with the patient, The patient agreed to proceed with receiving the bamlanimivab infusion and will be provided a copy of the Fact sheet prior to receiving the infusion.Fenton Foy 12/02/2019 9:46 AM

## 2019-12-03 MED ORDER — FLUCONAZOLE 150 MG PO TABS: 150.0000 mg | ORAL_TABLET | Freq: Every day | ORAL | 0 refills | Status: DC

## 2019-12-03 NOTE — Telephone Encounter (Signed)
She does have Covid and is getting ready to get an infusion.  She has had difficulty with thrush and I will call in Diflucan.  Left a message for her to keep in touch with me.

## 2019-12-05 ENCOUNTER — Other Ambulatory Visit: Payer: Self-pay | Admitting: Family Medicine

## 2019-12-05 DIAGNOSIS — B370 Candidal stomatitis: Secondary | ICD-10-CM

## 2019-12-05 MED ORDER — FLUCONAZOLE 150 MG PO TABS: 150.0000 mg | ORAL_TABLET | Freq: Every day | ORAL | 0 refills | Status: DC

## 2019-12-05 NOTE — Progress Notes (Signed)
She ;has thrush from Covid and still having trouble. If no better on Monday I'll arrange for her to be seen

## 2019-12-07 ENCOUNTER — Telehealth: Payer: Self-pay | Admitting: Internal Medicine

## 2019-12-07 NOTE — Telephone Encounter (Signed)
She will call in the morning

## 2019-12-07 NOTE — Telephone Encounter (Signed)
Left message again for pt to call back.

## 2019-12-07 NOTE — Telephone Encounter (Signed)
Left message for pt to call me back for me to schedule with Respirtary clinic

## 2019-12-09 ENCOUNTER — Ambulatory Visit: Payer: Self-pay

## 2019-12-09 NOTE — Telephone Encounter (Signed)
Pt has an appt tonight at 6:15pm at respiratory clinic

## 2019-12-10 ENCOUNTER — Ambulatory Visit: Payer: Self-pay

## 2020-01-04 ENCOUNTER — Telehealth: Payer: Self-pay

## 2020-01-04 NOTE — Telephone Encounter (Signed)
LVM for pt to schedule an covid follow up.

## 2020-01-11 ENCOUNTER — Other Ambulatory Visit: Payer: Self-pay | Admitting: Family Medicine

## 2020-01-11 DIAGNOSIS — G479 Sleep disorder, unspecified: Secondary | ICD-10-CM

## 2020-01-11 NOTE — Telephone Encounter (Signed)
[facility] . Please advise [facility] 

## 2020-01-19 ENCOUNTER — Ambulatory Visit (INDEPENDENT_AMBULATORY_CARE_PROVIDER_SITE_OTHER): Payer: BC Managed Care – PPO | Admitting: Family Medicine

## 2020-01-19 ENCOUNTER — Encounter: Payer: Self-pay | Admitting: Family Medicine

## 2020-01-19 VITALS — BP 118/80 | HR 76 | Temp 97.3°F | Wt 121.0 lb

## 2020-01-19 DIAGNOSIS — U071 COVID-19: Secondary | ICD-10-CM | POA: Diagnosis not present

## 2020-01-19 DIAGNOSIS — F901 Attention-deficit hyperactivity disorder, predominantly hyperactive type: Secondary | ICD-10-CM

## 2020-01-19 DIAGNOSIS — G479 Sleep disorder, unspecified: Secondary | ICD-10-CM

## 2020-01-19 NOTE — Progress Notes (Signed)
   Subjective:    Patient ID: Annette Murray, female    DOB: Feb 15, 1956, 64 y.o.   MRN: [id number]  HPI She is here for follow-up on Covid.  She was tested on February 24 and on March 4 was given the IV monoclonal antibodies.  At this time she is doing much better.  She states her taste is almost back to normal except for chocolate.  Her energy level has improved but she still does get fatigued quite early in the evening.  She does have sleep disturbance and has been on trazodone alternating this with melatonin.  She has been doing this for quite some time.  She also has underlying ADD and does use Adderall.  This gives her roughly 8 hours worth of benefit.   Review of Systems     Objective:   Physical Exam Alert and in no distress. Tympanic membranes and canals are normal. Pharyngeal area is normal. Neck is supple without adenopathy or thyromegaly. Cardiac exam shows a regular sinus rhythm without murmurs or gallops. Lungs are clear to auscultation.        Assessment & Plan:  COVID-19  ADHD (attention deficit hyperactivity disorder), predominantly hyperactive impulsive type  Sleep disturbance At this time I think she is doing quite well with Covid and no intervention is needed.  I think she will slowly return to normal.  She will continue on her present dosing of Adderall.  Recommend she use melatonin on a regular basis and only use trazodone as needed.  She seemed comfortable with this.

## 2020-02-04 ENCOUNTER — Telehealth: Payer: Self-pay | Admitting: Family Medicine

## 2020-02-04 DIAGNOSIS — F901 Attention-deficit hyperactivity disorder, predominantly hyperactive type: Secondary | ICD-10-CM

## 2020-02-04 MED ORDER — AMPHETAMINE-DEXTROAMPHET ER 20 MG PO CP24: 20.0000 mg | ORAL_CAPSULE | ORAL | 0 refills | Status: DC

## 2020-02-04 MED ORDER — AMPHETAMINE-DEXTROAMPHET ER 20 MG PO CP24: 20.0000 mg | ORAL_CAPSULE | Freq: Every day | ORAL | 0 refills | Status: DC

## 2020-02-04 NOTE — Telephone Encounter (Signed)
[facility]. Pt called for refills of Adderall XR. [facility] [address]. Pt can be reached at [phone].

## 2020-04-21 ENCOUNTER — Other Ambulatory Visit: Payer: Self-pay | Admitting: Obstetrics and Gynecology

## 2020-04-21 ENCOUNTER — Encounter: Payer: Self-pay | Admitting: Family Medicine

## 2020-04-21 ENCOUNTER — Telehealth: Payer: Self-pay | Admitting: Cardiology

## 2020-04-21 DIAGNOSIS — N6489 Other specified disorders of breast: Secondary | ICD-10-CM

## 2020-04-21 NOTE — Telephone Encounter (Signed)
Patient is scheduled an appointment for 07/28/20 with Dr. Meda Coffee. Per recall, patient will need an order placed to have lipid lab work completed 1 week prior to appointment. Please assist.

## 2020-04-28 DIAGNOSIS — M1812 Unilateral primary osteoarthritis of first carpometacarpal joint, left hand: Secondary | ICD-10-CM | POA: Diagnosis not present

## 2020-04-28 DIAGNOSIS — M67441 Ganglion, right hand: Secondary | ICD-10-CM | POA: Diagnosis not present

## 2020-04-28 DIAGNOSIS — M65311 Trigger thumb, right thumb: Secondary | ICD-10-CM | POA: Diagnosis not present

## 2020-05-02 ENCOUNTER — Other Ambulatory Visit: Payer: Self-pay | Admitting: Family Medicine

## 2020-05-02 DIAGNOSIS — G479 Sleep disorder, unspecified: Secondary | ICD-10-CM

## 2020-05-02 NOTE — Telephone Encounter (Signed)
[facility] trazod[facility]. Please advise KH 

## 2020-05-09 ENCOUNTER — Ambulatory Visit: Payer: Self-pay

## 2020-05-09 ENCOUNTER — Other Ambulatory Visit: Payer: Self-pay

## 2020-05-09 ENCOUNTER — Ambulatory Visit
Admission: RE | Admit: 2020-05-09 | Discharge: 2020-05-09 | Disposition: A | Discharge status: Home / Self Care | Payer: BC Managed Care – PPO | Source: Ambulatory Visit | Attending: Obstetrics and Gynecology | Admitting: Obstetrics and Gynecology

## 2020-05-09 DIAGNOSIS — N6489 Other specified disorders of breast: Secondary | ICD-10-CM

## 2020-05-09 DIAGNOSIS — R928 Other abnormal and inconclusive findings on diagnostic imaging of breast: Secondary | ICD-10-CM | POA: Diagnosis not present

## 2020-06-17 ENCOUNTER — Other Ambulatory Visit: Payer: Self-pay | Admitting: Medical

## 2020-06-17 ENCOUNTER — Telehealth: Payer: Self-pay | Admitting: Family Medicine

## 2020-06-17 MED ORDER — AMPHETAMINE-DEXTROAMPHET ER 20 MG PO CP24: 20.0000 mg | ORAL_CAPSULE | Freq: Every day | ORAL | 0 refills | Status: DC

## 2020-06-17 NOTE — Telephone Encounter (Signed)
Pt left message needs refill Ad[facility]all XR 20mg  [facility] [address]

## 2020-06-17 NOTE — Telephone Encounter (Signed)
done

## 2020-06-23 DIAGNOSIS — Z13820 Encounter for screening for osteoporosis: Secondary | ICD-10-CM | POA: Diagnosis not present

## 2020-06-23 DIAGNOSIS — Z01419 Encounter for gynecological examination (general) (routine) without abnormal findings: Secondary | ICD-10-CM | POA: Diagnosis not present

## 2020-06-23 DIAGNOSIS — Z6822 Body mass index (BMI) 22.0-22.9, adult: Secondary | ICD-10-CM | POA: Diagnosis not present

## 2020-06-23 LAB — HM DEXA SCAN: HM Dexa Scan: NORMAL

## 2020-07-18 ENCOUNTER — Encounter: Payer: BC Managed Care – PPO | Admitting: Family Medicine

## 2020-07-21 ENCOUNTER — Telehealth: Payer: Self-pay | Admitting: Cardiology

## 2020-07-21 NOTE — Telephone Encounter (Signed)
Spoke with pt and advised no orders for labs have been entered for pt prior to 840am 07/28/2020 appointment; however Dr Meda Coffee may order during appointment.  Suggested pt come to appointment fasting if possible and bring a small snack for afterwards.  Pt has not been seen in this office since 2019.  Pt verbalizes understanding and agrees with current plan.

## 2020-07-21 NOTE — Telephone Encounter (Signed)
Annette Murray is calling stating she was advised someone would be contacting her to schedule labs prior to her appointment, but she has not heard anything. There's is no active request in the system. Please advise.

## 2020-07-28 ENCOUNTER — Encounter: Payer: Self-pay | Admitting: Cardiology

## 2020-07-28 ENCOUNTER — Ambulatory Visit (INDEPENDENT_AMBULATORY_CARE_PROVIDER_SITE_OTHER): Payer: BC Managed Care – PPO | Admitting: Cardiology

## 2020-07-28 ENCOUNTER — Other Ambulatory Visit: Payer: Self-pay | Admitting: Family Medicine

## 2020-07-28 ENCOUNTER — Other Ambulatory Visit: Payer: Self-pay

## 2020-07-28 VITALS — BP 130/78 | HR 73 | Ht 61.0 in | Wt 121.8 lb

## 2020-07-28 DIAGNOSIS — I10 Essential (primary) hypertension: Secondary | ICD-10-CM

## 2020-07-28 DIAGNOSIS — E785 Hyperlipidemia, unspecified: Secondary | ICD-10-CM | POA: Diagnosis not present

## 2020-07-28 DIAGNOSIS — G479 Sleep disorder, unspecified: Secondary | ICD-10-CM

## 2020-07-28 DIAGNOSIS — I059 Rheumatic mitral valve disease, unspecified: Secondary | ICD-10-CM

## 2020-07-28 DIAGNOSIS — I7300 Raynaud's syndrome without gangrene: Secondary | ICD-10-CM

## 2020-07-28 DIAGNOSIS — Z951 Presence of aortocoronary bypass graft: Secondary | ICD-10-CM

## 2020-07-28 DIAGNOSIS — Z8249 Family history of ischemic heart disease and other diseases of the circulatory system: Secondary | ICD-10-CM

## 2020-07-28 MED ORDER — ASPIRIN EC 81 MG PO TBEC: 81.0000 mg | DELAYED_RELEASE_TABLET | Freq: Every day | ORAL | 3 refills | Status: DC

## 2020-07-28 NOTE — Patient Instructions (Signed)
Medication Instructions:   DECREASE YOUR ASPIRIN TO TAKING 81 MG BY MOUTH ONCE DAILY  *If you need a refill on your cardiac medications before your next appointment, please call your pharmacy*  Lab Work:  TODAY--CMET, CBC, TSH, VITAMIN D LEVEL, AND LIPIDS  If you have labs (blood work) drawn today and your tests are completely normal, you will receive your results only by: Marland Kitchen MyChart Message (if you have MyChart) OR . A paper copy in the mail If you have any lab test that is abnormal or we need to change your treatment, we will call you to review the results.  Testing/Procedures:  Your physician has requested that you have an echocardiogram. Echocardiography is a painless test that uses sound waves to create images of your heart. It provides your doctor with information about the size and shape of your heart and how well your heart's chambers and valves are working. This procedure takes approximately one hour. There are no restrictions for this procedure.  CARDIAC CALCIUM SCORE TO BE DONE HERE IN THE OFFICE AT NEXT AVAILABLE APPOINTMENT   Follow-Up: At [facility], you and your health needs are our priority.  As part of our continuing mission to provide you with exceptional heart care, we have created designated Provider Care Teams.  These Care Teams include your primary Cardiologist (physician) and Advanced Practice Providers (APPs -  Physician Assistants and Nurse Practitioners) who all work together to provide you with the care you need, when you need it.  We recommend signing up for the patient portal called "MyChart".  Sign up information is provided on this After Visit Summary.  MyChart is used to connect with patients for Virtual Visits (Telemedicine).  Patients are able to view lab/test results, encounter notes, upcoming appointments, etc.  Non-urgent messages can be sent to your provider as well.   To learn more about what you can do with MyChart, go to [email or web address].     Your next appointment:   1 year(s)  The format for your next appointment:   In Person  Provider:   Ena Dawley, MD

## 2020-07-28 NOTE — Telephone Encounter (Signed)
[facility]. Please advise [facility]

## 2020-07-28 NOTE — Progress Notes (Signed)
Cardiology Office Note:    Date:  07/31/2020   ID:  Anevay, Campanella [birth year], MRN [id number]  PCP:  Denita Lung, MD  [facility] Cardiologist:  Ena Dawley, MD  [facility] Electrophysiologist:  None   Referring MD: Denita Lung, MD    Reason for visit: 1 year follow-up   History of Present Illness:    Annette Murray is a 64 y.o. female with a hx of mitral valve prolapse, hypertension, vitiligo, arthritis and ADD. She may have had rheumatic fever as a child, panic attacks. An echocardiogram in 03/2010 showed a bileaflet prolapse and moderate MR.  Echo in 09/2011 did not demonstrate frank prolapse of the mitral valve.  It was documented that her palpitations were well controlled on atenolol.  She had no appreciable murmur at the time.  Patient is coming after a year she had Covid in March 2021 with prolonged fevers and shortness of breath.  Last year she was offered to undergo calcium scoring but then had to postpone.  She has significant family history of early coronary artery disease and hyperlipidemia, her mom had CABG x5.  She currently denies any chest pain or shortness of breath no palpitation dizziness or syncope.  Past Medical History:  Diagnosis Date  . ADD (attention deficit disorder)   . Allergy    RHINITIS  . Arthritis   . Complication of anesthesia    itching   . FH: colonic polyps   . Heart murmur    MVP  . Herpes labialis   . Hypertension    borderline  . MVP (mitral valve prolapse)   . Pneumonia    hx of   . Vitiligo     Past Surgical History:  Procedure Laterality Date  . KNEE ARTHROPLASTY Left 07/10/2018   Procedure: LEFT TOTAL KNEE ARTHROPLASTY WITH COMPUTER NAVIGATION;  Surgeon: Rod Can, MD;  Location: [facility];  Service: Orthopedics;  Laterality: Left;  block  . KNEE ARTHROSCOPY     right x 2   right knee re built 15 years ago  . TONSILLECTOMY    . TOTAL KNEE ARTHROPLASTY  04/28/2012   Procedure: TOTAL KNEE  ARTHROPLASTY;  Surgeon: Gearlean Alf, MD;  Location: [facility];  Service: Orthopedics;  Laterality: Right;  . TUBAL LIGATION      Current Medications: Current Meds  Medication Sig  . amoxicillin (AMOXIL) 500 MG capsule Take 2,000 mg by mouth See admin instructions. 1 hour prior to dental visit   . amphetamine-dextroamphetamine (ADDERALL XR) 20 MG 24 hr capsule Take 1 capsule (20 mg total) by mouth daily.  Marland Kitchen diltiazem (CARDIZEM CD) 120 MG 24 hr capsule TAKE 1 CAPSULE BY MOUTH EVERY DAY  . fluticasone (FLONASE) 50 MCG/ACT nasal spray Place 1 spray into both nostrils at bedtime as needed for allergies or rhinitis.  . hydrochlorothiazide (MICROZIDE) 12.5 MG capsule Take 1 capsule (12.5 mg total) by mouth daily.  . hydroquinone 4 % cream APPLY TO AFFECTED AREA TWICE A DAY  . Multiple Vitamin (MULTIVITAMIN WITH MINERALS) TABS tablet Take 1 tablet by mouth daily.  Marland Kitchen PREMARIN vaginal cream INSERT 0.5 APPLICATOR(S)FUL TWICE A WEEK BY VAGINAL ROUTE.  . traZODone (DESYREL) 50 MG tablet TAKE 1 TABLET BY MOUTH EVERYDAY AT BEDTIME  . valACYclovir (VALTREX) 500 MG tablet TAKE 1 TABLET (500 MG TOTAL) BY MOUTH EVERY MORNING.  . [DISCONTINUED] aspirin 81 MG chewable tablet Chew 1 tablet (81 mg total) by mouth 2 (two) times daily.  Allergies:   Codeine   Social History   Socioeconomic History  . Marital status: Married    Spouse name: Not on file  . Number of children: Not on file  . Years of education: Not on file  . Highest education level: Not on file  Occupational History  . Not on file  Tobacco Use  . Smoking status: Never Smoker  . Smokeless tobacco: Never Used  Vaping Use  . Vaping Use: Never used  Substance and Sexual Activity  . Alcohol use: Yes    Alcohol/week: 2.0 standard drinks    Types: 1 Cans of beer, 1 Shots of liquor per week    Comment: 2- drimks daily  . Drug use: No  . Sexual activity: Yes  Other Topics Concern  . Not on file  Social History Narrative  . Not on file    Social Determinants of Health   Financial Resource Strain:   . Difficulty of Paying Living Expenses: Not on file  Food Insecurity:   . Worried About Charity fundraiser in the Last Year: Not on file  . Ran Out of Food in the Last Year: Not on file  Transportation Needs:   . Lack of Transportation (Medical): Not on file  . Lack of Transportation (Non-Medical): Not on file  Physical Activity:   . Days of Exercise per Week: Not on file  . Minutes of Exercise per Session: Not on file  Stress:   . Feeling of Stress : Not on file  Social Connections:   . Frequency of Communication with Friends and Family: Not on file  . Frequency of Social Gatherings with Friends and Family: Not on file  . Attends Religious Services: Not on file  . Active Member of Clubs or Organizations: Not on file  . Attends Archivist Meetings: Not on file  . Marital Status: Not on file     Family History: The patient's family history includes Arthritis in her mother; Asthma in her mother; Breast cancer in her maternal aunt.  ROS:   Please see the history of present illness.     All other systems reviewed and are negative.  EKGs/Labs/Other Studies Reviewed:    The following studies were reviewed today:  EKG:  EKG is ordered today.  The ekg ordered today demonstrates normal sinus rhythm, PACs otherwise normal EKG.  This was personally reviewed.  Recent Labs: 07/28/2020: ALT 22; BUN 11; Creatinine, Ser 0.79; Hemoglobin 15.3; Platelets 232; Potassium 4.0; Sodium 139; TSH 1.420  Recent Lipid Panel    Component Value Date/Time   CHOL 300 (H) 07/28/2020 0946   TRIG 109 07/28/2020 0946   HDL 124 07/28/2020 0946   CHOLHDL 2.4 07/28/2020 0946   CHOLHDL 2.1 05/20/2017 1435   VLDL 15 05/20/2017 1435   LDLCALC 158 (H) 07/28/2020 0946    Physical Exam:    VS:  BP 130/78   Pulse 73   Ht _0  (1.549 m)   Wt 121 lb 12.8 oz (55.2 kg)   SpO2 98%   BMI 23.01 kg/m     Wt Readings from Last 3  Encounters:  07/28/20 121 lb 12.8 oz (55.2 kg)  01/19/20 121 lb (54.9 kg)  07/07/19 120 lb 3.2 oz (54.5 kg)     GEN:  Well nourished, well developed in no acute distress HEENT: Normal NECK: No JVD; No carotid bruits LYMPHATICS: No lymphadenopathy CARDIAC: RRR, no murmurs, rubs, gallops RESPIRATORY:  Clear to auscultation without rales, wheezing or rhonchi  ABDOMEN: Soft, non-tender, non-distended MUSCULOSKELETAL:  No edema; No deformity  SKIN: Warm and dry NEUROLOGIC:  Alert and oriented x 3 PSYCHIATRIC:  Normal affect   TTE: 06/24/2018  - Left ventricle: The cavity size was normal. Systolic function was  normal. The estimated ejection fraction was in the range of 60%  to 65%. Wall motion was normal; there were no regional wall  motion abnormalities. Doppler parameters are consistent with  abnormal left ventricular relaxation (grade 1 diastolic  dysfunction). There was no evidence of elevated ventricular  filling pressure by Doppler parameters.  - Aortic valve: There was no regurgitation.  - Mitral valve: Mildly thickened leaflets . Systolic bowing without  prolapse. There was moderate regurgitation, with multiple jets  directed centrally.  - Left atrium: The atrium was normal in size.  - Right atrium: The atrium was normal in size.  - Tricuspid valve: There was mild regurgitation.  - Pulmonary arteries: Systolic pressure was within the normal  range.  - Pericardium, extracardiac: There was no pericardial effusion.    ASSESSMENT:    1. Essential hypertension   2. Mitral valve disorder   3. Hyperlipidemia LDL goal <100   4. Family history of early CAD   54. S/P CABG x 5   6. Raynaud's disease without gangrene   7. Hyperlipidemia, unspecified hyperlipidemia type    PLAN:    In order of problems listed above:  Recent COVID-19 infection with prolonged course  -Normal EKG, we will obtain an echocardiogram to evaluate for systolic and diastolic  function.    Mitral valve prolapse? -Bileaflet valve noted on echo in 2011, however echocardiogram in 2012 showed no frank prolapse. -No murmur noted -We will repeat echocardiogram now.Palpitations: Did not tolerated BB due to raynaud's syndrome. Now on diltiazem and no palpitations for many years.   Hyperlipidemia and family history of premature coronary artery disease -We will obtain calcium scoring and labs in the side on cholesterol management based on both results. -Lipids in 2020 LDL 162, HDL 117 and triglycerides 226.  Medication Adjustments/Labs and Tests Ordered: Current medicines are reviewed at length with the patient today.  Concerns regarding medicines are outlined above.  Orders Placed This Encounter  Procedures  . CT CARDIAC SCORING  . Comp Met (CMET)  . CBC  . TSH  . Vitamin D (25 hydroxy)  . Lipid Profile  . EKG 12-Lead  . ECHOCARDIOGRAM COMPLETE   Meds ordered this encounter  Medications  . aspirin EC 81 MG tablet    Sig: Take 1 tablet (81 mg total) by mouth daily. Swallow whole.    Dispense:  90 tablet    Refill:  3    Patient Instructions  Medication Instructions:   DECREASE YOUR ASPIRIN TO TAKING 81 MG BY MOUTH ONCE DAILY  *If you need a refill on your cardiac medications before your next appointment, please call your pharmacy*  Lab Work:  TODAY--CMET, CBC, TSH, VITAMIN D LEVEL, AND LIPIDS  If you have labs (blood work) drawn today and your tests are completely normal, you will receive your results only by: Marland Kitchen MyChart Message (if you have MyChart) OR . A paper copy in the mail If you have any lab test that is abnormal or we need to change your treatment, we will call you to review the results.  Testing/Procedures:  Your physician has requested that you have an echocardiogram. Echocardiography is a painless test that uses sound waves to create images of your heart. It provides your doctor with  information about the size and shape of your heart and  how well your heart's chambers and valves are working. This procedure takes approximately one hour. There are no restrictions for this procedure.  CARDIAC CALCIUM SCORE TO BE DONE HERE IN THE OFFICE AT NEXT AVAILABLE APPOINTMENT   Follow-Up: At [facility], you and your health needs are our priority.  As part of our continuing mission to provide you with exceptional heart care, we have created designated Provider Care Teams.  These Care Teams include your primary Cardiologist (physician) and Advanced Practice Providers (APPs -  Physician Assistants and Nurse Practitioners) who all work together to provide you with the care you need, when you need it.  We recommend signing up for the patient portal called "MyChart".  Sign up information is provided on this After Visit Summary.  MyChart is used to connect with patients for Virtual Visits (Telemedicine).  Patients are able to view lab/test results, encounter notes, upcoming appointments, etc.  Non-urgent messages can be sent to your provider as well.   To learn more about what you can do with MyChart, go to [email or web address].    Your next appointment:   1 year(s)  The format for your next appointment:   In Person  Provider:   Ena Dawley, MD      Signed, Ena Dawley, MD  07/31/2020 10:04 PM    [place]

## 2020-07-29 LAB — COMPREHENSIVE METABOLIC PANEL
ALT: 22 IU/L (ref 0–32)
AST: 26 IU/L (ref 0–40)
Albumin/Globulin Ratio: 2.5 — ABNORMAL HIGH (ref 1.2–2.2)
Albumin: 4.7 g/dL (ref 3.8–4.8)
Alkaline Phosphatase: 101 IU/L (ref 44–121)
BUN/Creatinine Ratio: 14 (ref 12–28)
BUN: 11 mg/dL (ref 8–27)
Bilirubin Total: 0.6 mg/dL (ref 0.0–1.2)
CO2: 25 mmol/L (ref 20–29)
Calcium: 10.3 mg/dL (ref 8.7–10.3)
Chloride: 98 mmol/L (ref 96–106)
Creatinine, Ser: 0.79 mg/dL (ref 0.57–1.00)
GFR calc Af Amer: 91 mL/min/{1.73_m2} (ref >59)
GFR calc non Af Amer: 79 mL/min/{1.73_m2} (ref >59)
Globulin, Total: 1.9 g/dL (ref 1.5–4.5)
Glucose: 102 mg/dL — ABNORMAL HIGH (ref 65–99)
Potassium: 4 mmol/L (ref 3.5–5.2)
Sodium: 139 mmol/L (ref 134–144)
Total Protein: 6.6 g/dL (ref 6.0–8.5)

## 2020-07-29 LAB — LIPID PANEL
Chol/HDL Ratio: 2.4 ratio (ref 0.0–4.4)
Cholesterol, Total: 300 mg/dL — ABNORMAL HIGH (ref 100–199)
HDL: 124 mg/dL (ref >39)
LDL Chol Calc (NIH): 158 mg/dL — ABNORMAL HIGH (ref 0–99)
Triglycerides: 109 mg/dL (ref 0–149)
VLDL Cholesterol Cal: 18 mg/dL (ref 5–40)

## 2020-07-29 LAB — CBC
Hematocrit: 42.5 % (ref 34.0–46.6)
Hemoglobin: 15.3 g/dL (ref 11.1–15.9)
MCH: 37 pg — ABNORMAL HIGH (ref 26.6–33.0)
MCHC: 36 g/dL — ABNORMAL HIGH (ref 31.5–35.7)
MCV: 103 fL — ABNORMAL HIGH (ref 79–97)
Platelets: 232 10*3/uL (ref 150–450)
RBC: 4.13 x10E6/uL (ref 3.77–5.28)
RDW: 11.6 % — ABNORMAL LOW (ref 11.7–15.4)
WBC: 4.9 10*3/uL (ref 3.4–10.8)

## 2020-07-29 LAB — VITAMIN D 25 HYDROXY (VIT D DEFICIENCY, FRACTURES): Vit D, 25-Hydroxy: 64.9 ng/mL (ref 30.0–100.0)

## 2020-07-29 LAB — TSH: TSH: 1.42 u[IU]/mL (ref 0.450–4.500)

## 2020-08-01 ENCOUNTER — Telehealth: Payer: Self-pay

## 2020-08-01 MED ORDER — AMPHETAMINE-DEXTROAMPHET ER 20 MG PO CP24: 20.0000 mg | ORAL_CAPSULE | Freq: Every day | ORAL | 0 refills | Status: DC

## 2020-08-01 NOTE — Telephone Encounter (Signed)
Pt. Called LM stating she [facility] [address] [facility]. Last apt was 01/19/20 and next apt is 10/13/20.

## 2020-08-14 IMAGING — DX DG KNEE 1-2V PORT*L*
2 series · 2 of 2 positions shown · non-contrast
Comparison: None.

CLINICAL DATA: Status post left total knee replacement - PACU
images

EXAM:
PORTABLE LEFT KNEE - 1-2 VIEW

[knee ap]
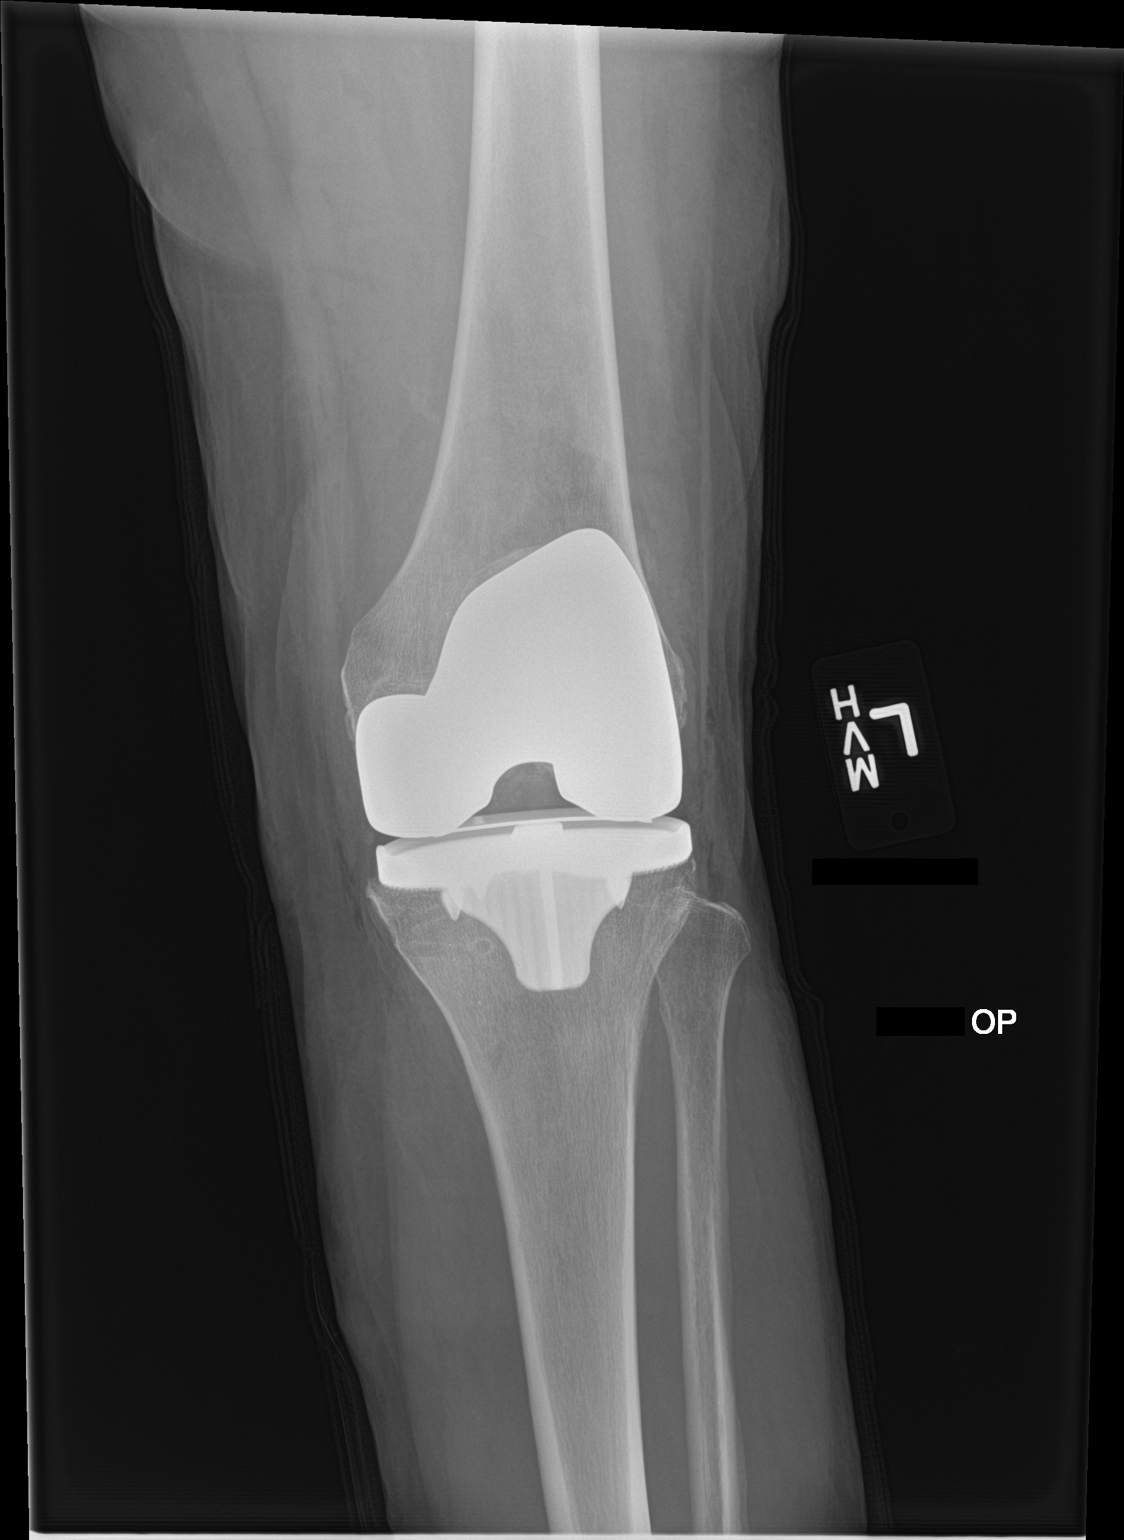

[knee lat]
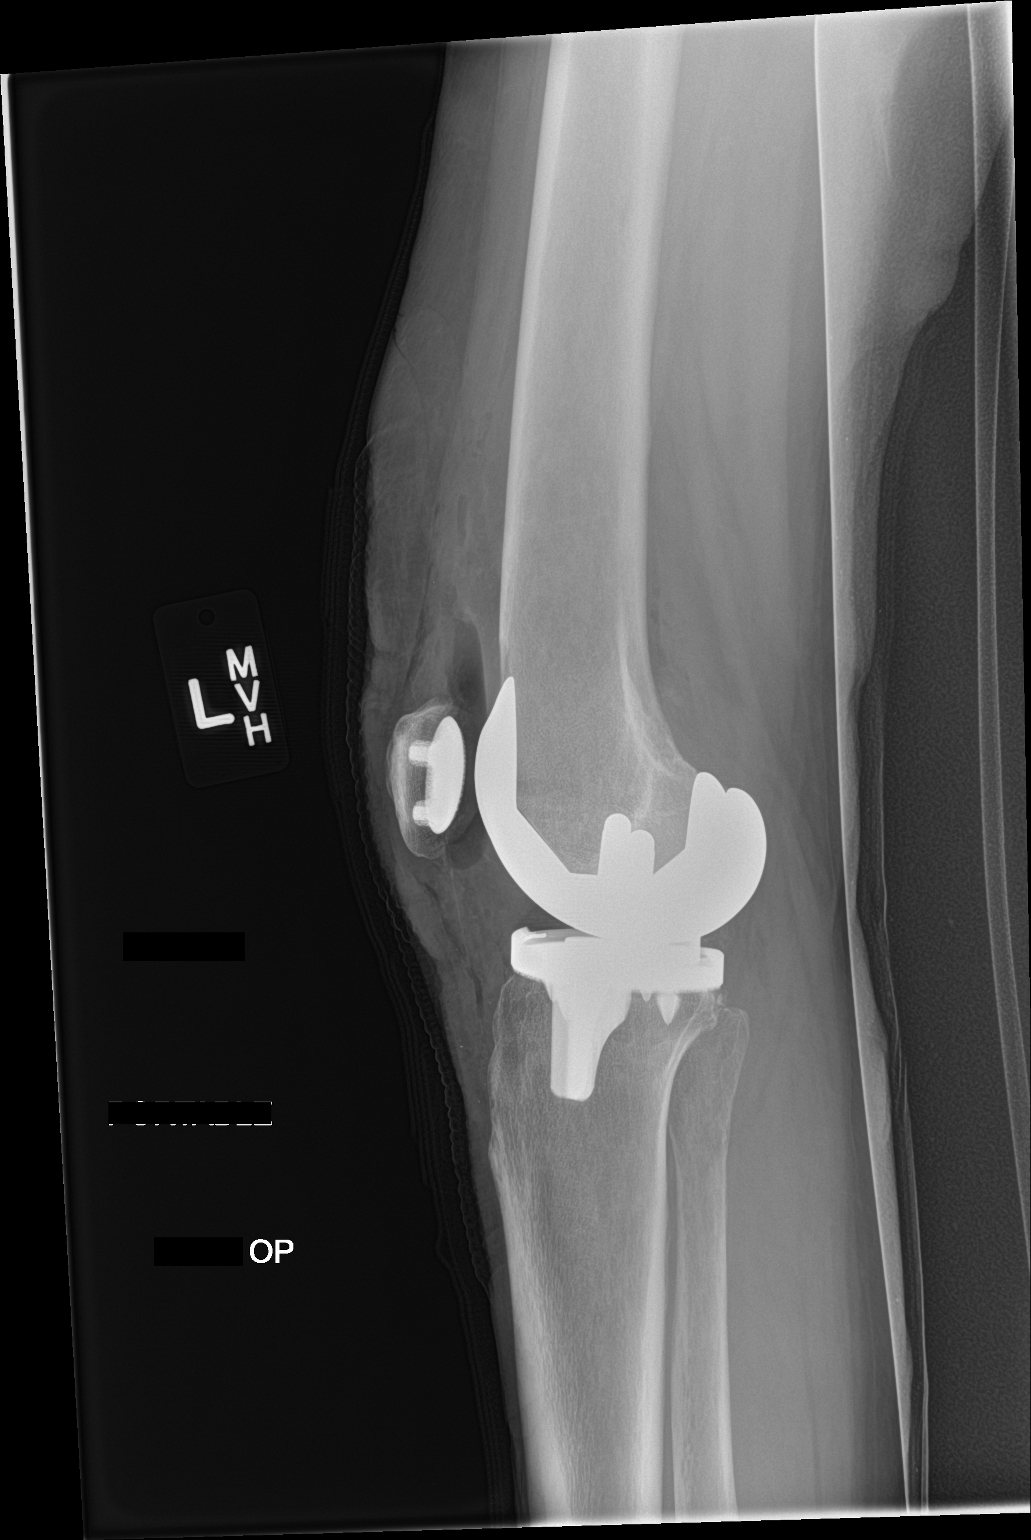

[2 of 2 positions shown; findings below may reference images not displayed]

FINDINGS: The patient has undergone total knee arthroplasty. The hardware is
well-seated. There is no dislocation. Soft tissue and joint space
gas are present following surgery.
IMPRESSION: Status post total knee arthroplasty.  No adverse features.

## 2020-08-15 ENCOUNTER — Other Ambulatory Visit: Payer: Self-pay

## 2020-08-15 ENCOUNTER — Ambulatory Visit (INDEPENDENT_AMBULATORY_CARE_PROVIDER_SITE_OTHER)
Admission: RE | Admit: 2020-08-15 | Discharge: 2020-08-15 | Disposition: A | Discharge status: Home / Self Care | Payer: Self-pay | Source: Ambulatory Visit | Attending: Cardiology | Admitting: Cardiology

## 2020-08-15 ENCOUNTER — Telehealth: Payer: Self-pay | Admitting: *Deleted

## 2020-08-15 ENCOUNTER — Other Ambulatory Visit: Payer: Self-pay | Admitting: Family Medicine

## 2020-08-15 DIAGNOSIS — E7800 Pure hypercholesterolemia, unspecified: Secondary | ICD-10-CM

## 2020-08-15 DIAGNOSIS — Z79899 Other long term (current) drug therapy: Secondary | ICD-10-CM

## 2020-08-15 DIAGNOSIS — Z8249 Family history of ischemic heart disease and other diseases of the circulatory system: Secondary | ICD-10-CM

## 2020-08-15 DIAGNOSIS — Z951 Presence of aortocoronary bypass graft: Secondary | ICD-10-CM

## 2020-08-15 DIAGNOSIS — I10 Essential (primary) hypertension: Secondary | ICD-10-CM

## 2020-08-15 DIAGNOSIS — E785 Hyperlipidemia, unspecified: Secondary | ICD-10-CM

## 2020-08-15 DIAGNOSIS — I7300 Raynaud's syndrome without gangrene: Secondary | ICD-10-CM

## 2020-08-15 DIAGNOSIS — E782 Mixed hyperlipidemia: Secondary | ICD-10-CM

## 2020-08-15 MED ORDER — ROSUVASTATIN CALCIUM 5 MG PO TABS: 5.0000 mg | ORAL_TABLET | Freq: Every day | ORAL | 1 refills | Status: DC

## 2020-08-15 NOTE — Telephone Encounter (Signed)
Spoke with the pt and endorsed to her, her Calcium Score results and recommendations per Dr. Meda Coffee, for her to start taking rosuvastatin 5 mg po daily, and recheck lipids and LFTs in 4-6 weeks.  Informed the pt this is based on her significantly elevated LDL and TG.  Scheduled the pt to come back in, in 6 weeks on 12/29, to recheck lipids and LFTs.  Pt is aware to come fasting to this lab appointment.  Confirmed the pharmacy of choice with the pt.  Pt verbalized understanding and agrees with this plan.

## 2020-08-15 NOTE — Telephone Encounter (Signed)
-----   Message from Dorothy Spark, MD sent at 08/15/2020  2:12 PM EST ----- She has significantly elevated LDL and TG, however her calcium score is 0, therefore I would recommend a low dose of rosuvastatin 5 mg po daily and repeat lipids and LFTs in 4-6 weeks.

## 2020-08-19 ENCOUNTER — Other Ambulatory Visit: Payer: Self-pay

## 2020-08-19 ENCOUNTER — Ambulatory Visit (HOSPITAL_COMMUNITY): Payer: BC Managed Care – PPO | Attending: Internal Medicine

## 2020-08-19 DIAGNOSIS — Z951 Presence of aortocoronary bypass graft: Secondary | ICD-10-CM

## 2020-08-19 DIAGNOSIS — I059 Rheumatic mitral valve disease, unspecified: Secondary | ICD-10-CM | POA: Insufficient documentation

## 2020-08-19 DIAGNOSIS — I10 Essential (primary) hypertension: Secondary | ICD-10-CM | POA: Diagnosis not present

## 2020-08-19 DIAGNOSIS — Z8249 Family history of ischemic heart disease and other diseases of the circulatory system: Secondary | ICD-10-CM | POA: Diagnosis not present

## 2020-08-19 LAB — ECHOCARDIOGRAM COMPLETE
Area-P 1/2: 3.39 cm2
S' Lateral: 2.4 cm

## 2020-09-06 ENCOUNTER — Telehealth: Payer: Self-pay | Admitting: Family Medicine

## 2020-09-06 MED ORDER — AMPHETAMINE-DEXTROAMPHET ER 20 MG PO CP24: 20.0000 mg | ORAL_CAPSULE | Freq: Every day | ORAL | 0 refills | Status: DC

## 2020-09-06 NOTE — Telephone Encounter (Signed)
Pt needs [facility]ill [facility] [address]

## 2020-09-07 ENCOUNTER — Encounter: Payer: Self-pay | Admitting: Family Medicine

## 2020-09-07 ENCOUNTER — Other Ambulatory Visit: Payer: Self-pay | Admitting: Family Medicine

## 2020-09-07 DIAGNOSIS — B001 Herpesviral vesicular dermatitis: Secondary | ICD-10-CM

## 2020-09-07 NOTE — Telephone Encounter (Signed)
[facility]. Please advise [facility]

## 2020-09-28 ENCOUNTER — Other Ambulatory Visit: Payer: Self-pay

## 2020-09-28 ENCOUNTER — Other Ambulatory Visit: Payer: BC Managed Care – PPO | Admitting: *Deleted

## 2020-09-28 ENCOUNTER — Other Ambulatory Visit: Payer: Self-pay | Admitting: *Deleted

## 2020-09-28 DIAGNOSIS — Z79899 Other long term (current) drug therapy: Secondary | ICD-10-CM | POA: Diagnosis not present

## 2020-09-28 DIAGNOSIS — E7800 Pure hypercholesterolemia, unspecified: Secondary | ICD-10-CM

## 2020-09-28 DIAGNOSIS — Z951 Presence of aortocoronary bypass graft: Secondary | ICD-10-CM

## 2020-09-28 DIAGNOSIS — E782 Mixed hyperlipidemia: Secondary | ICD-10-CM

## 2020-09-28 DIAGNOSIS — E785 Hyperlipidemia, unspecified: Secondary | ICD-10-CM | POA: Diagnosis not present

## 2020-09-28 LAB — LIPID PANEL
Chol/HDL Ratio: 2.2 ratio (ref 0.0–4.4)
Cholesterol, Total: 256 mg/dL — ABNORMAL HIGH (ref 100–199)
HDL: 114 mg/dL (ref >39)
LDL Chol Calc (NIH): 120 mg/dL — ABNORMAL HIGH (ref 0–99)
Triglycerides: 133 mg/dL (ref 0–149)
VLDL Cholesterol Cal: 22 mg/dL (ref 5–40)

## 2020-09-28 LAB — HEPATIC FUNCTION PANEL
ALT: 21 IU/L (ref 0–32)
AST: 20 IU/L (ref 0–40)
Albumin: 4.7 g/dL (ref 3.8–4.8)
Alkaline Phosphatase: 104 IU/L (ref 44–121)
Bilirubin Total: 0.5 mg/dL (ref 0.0–1.2)
Bilirubin, Direct: 0.16 mg/dL (ref 0.00–0.40)
Total Protein: 6.7 g/dL (ref 6.0–8.5)

## 2020-10-13 ENCOUNTER — Encounter: Payer: BC Managed Care – PPO | Admitting: Family Medicine

## 2020-10-19 ENCOUNTER — Other Ambulatory Visit: Payer: Self-pay

## 2020-10-19 ENCOUNTER — Ambulatory Visit (INDEPENDENT_AMBULATORY_CARE_PROVIDER_SITE_OTHER): Payer: BC Managed Care – PPO | Admitting: Family Medicine

## 2020-10-19 ENCOUNTER — Encounter: Payer: Self-pay | Admitting: Family Medicine

## 2020-10-19 VITALS — BP 106/70 | HR 77 | Temp 97.8°F | Ht 62.0 in | Wt 130.0 lb

## 2020-10-19 DIAGNOSIS — F901 Attention-deficit hyperactivity disorder, predominantly hyperactive type: Secondary | ICD-10-CM | POA: Diagnosis not present

## 2020-10-19 DIAGNOSIS — Z800 Family history of malignant neoplasm of digestive organs: Secondary | ICD-10-CM

## 2020-10-19 DIAGNOSIS — Z23 Encounter for immunization: Secondary | ICD-10-CM | POA: Diagnosis not present

## 2020-10-19 DIAGNOSIS — Z0000 Encounter for general adult medical examination without abnormal findings: Secondary | ICD-10-CM | POA: Diagnosis not present

## 2020-10-19 DIAGNOSIS — I7300 Raynaud's syndrome without gangrene: Secondary | ICD-10-CM

## 2020-10-19 DIAGNOSIS — Z96653 Presence of artificial knee joint, bilateral: Secondary | ICD-10-CM

## 2020-10-19 DIAGNOSIS — I10 Essential (primary) hypertension: Secondary | ICD-10-CM | POA: Diagnosis not present

## 2020-10-19 DIAGNOSIS — G479 Sleep disorder, unspecified: Secondary | ICD-10-CM

## 2020-10-19 DIAGNOSIS — I059 Rheumatic mitral valve disease, unspecified: Secondary | ICD-10-CM | POA: Diagnosis not present

## 2020-10-19 DIAGNOSIS — B001 Herpesviral vesicular dermatitis: Secondary | ICD-10-CM

## 2020-10-19 DIAGNOSIS — E785 Hyperlipidemia, unspecified: Secondary | ICD-10-CM

## 2020-10-19 NOTE — Progress Notes (Signed)
   Subjective:    Patient ID: Annette Murray, female    DOB: 1956-04-15, 65 y.o.   MRN: [id number]  HPI She is here for complete examination.  She has had recent knee surgery and has now had both knees replaced.  She still does have some arthritic changes especially in her hands and has seen orthopedics for that.  She saw her gynecologist in June and had DEXA scan, mammogram, Pap and pelvic.  Prior to her most recent surgery she did see cardiology.  She does have an underlying history of mitral valve prolapse.  Review of her record indicates no family history of premature heart disease.  Her father did have heart disease however he was in his late 61s.  Her cardiac calcium score was 0.  Apparently she was placed on a Statin.  She does have a family history of colon cancer and is on a 5-year schedule.  She does occasionally have difficulties with herpes labialis.  She continues to do quite nicely on her ADD medications.  She does use trazodone to help with her sleep.  Raynaud's disease does cause difficulty at this time a year.   Review of Systems     Objective:   Physical Exam Alert and in no distress. Tympanic membranes and canals are normal. Pharyngeal area is normal. Neck is supple without adenopathy or thyromegaly. Cardiac exam shows a regular sinus rhythm without murmurs or gallops. Lungs are clear to auscultation.        Assessment & Plan:  Mitral valve disorder  ADHD (attention deficit hyperactivity disorder), predominantly hyperactive impulsive type  Status post total bilateral knee replacement  Essential hypertension  Sleep disturbance  Family history of colon cancer  Hyperlipidemia LDL goal <100  Raynaud's disease without gangrene  Herpes labialis  She will continue on her present medication regimen.  Discussed the statin drug with her and explained that I did not think it was really necessary for her with her 0 calcium score and no good family history of heart  disease. Tdap given.

## 2020-10-27 DIAGNOSIS — M1812 Unilateral primary osteoarthritis of first carpometacarpal joint, left hand: Secondary | ICD-10-CM | POA: Diagnosis not present

## 2020-11-08 ENCOUNTER — Telehealth: Payer: Self-pay | Admitting: Internal Medicine

## 2020-11-08 ENCOUNTER — Other Ambulatory Visit: Payer: Self-pay | Admitting: Family Medicine

## 2020-11-08 DIAGNOSIS — I10 Essential (primary) hypertension: Secondary | ICD-10-CM

## 2020-11-08 MED ORDER — AMPHETAMINE-DEXTROAMPHET ER 20 MG PO CP24: 20.0000 mg | ORAL_CAPSULE | Freq: Every day | ORAL | 0 refills | Status: DC

## 2020-11-08 NOTE — Telephone Encounter (Signed)
[facility] [address]

## 2020-12-19 ENCOUNTER — Telehealth: Payer: Self-pay | Admitting: Family Medicine

## 2020-12-19 DIAGNOSIS — L814 Other melanin hyperpigmentation: Secondary | ICD-10-CM

## 2020-12-19 MED ORDER — HYDROQUINONE 4 % EX CREA: TOPICAL_CREAM | CUTANEOUS | 6 refills | Status: DC

## 2020-12-19 MED ORDER — AMPHETAMINE-DEXTROAMPHET ER 20 MG PO CP24: 20.0000 mg | ORAL_CAPSULE | Freq: Every day | ORAL | 0 refills | Status: DC

## 2020-12-19 NOTE — Telephone Encounter (Signed)
Pt called for refills of adderall XR and hydroquinone 4% cream. [facility] [address] And can be reached at [phone].

## 2021-01-24 ENCOUNTER — Telehealth: Payer: Self-pay | Admitting: Family Medicine

## 2021-01-24 MED ORDER — AMPHETAMINE-DEXTROAMPHET ER 20 MG PO CP24: 20.0000 mg | ORAL_CAPSULE | Freq: Every day | ORAL | 0 refills | Status: DC

## 2021-01-24 NOTE — Telephone Encounter (Signed)
Pt called and is requesting a refill for her adderall [id number] [place], [address] AT [address]

## 2021-02-02 DIAGNOSIS — M1812 Unilateral primary osteoarthritis of first carpometacarpal joint, left hand: Secondary | ICD-10-CM | POA: Diagnosis not present

## 2021-02-02 DIAGNOSIS — M65311 Trigger thumb, right thumb: Secondary | ICD-10-CM | POA: Diagnosis not present

## 2021-02-14 DIAGNOSIS — S92355A Nondisplaced fracture of fifth metatarsal bone, left foot, initial encounter for closed fracture: Secondary | ICD-10-CM | POA: Diagnosis not present

## 2021-03-03 DIAGNOSIS — S92353A Displaced fracture of fifth metatarsal bone, unspecified foot, initial encounter for closed fracture: Secondary | ICD-10-CM | POA: Insufficient documentation

## 2021-03-03 DIAGNOSIS — S92355A Nondisplaced fracture of fifth metatarsal bone, left foot, initial encounter for closed fracture: Secondary | ICD-10-CM | POA: Diagnosis not present

## 2021-03-03 DIAGNOSIS — M79672 Pain in left foot: Secondary | ICD-10-CM | POA: Diagnosis not present

## 2021-03-04 ENCOUNTER — Other Ambulatory Visit: Payer: Self-pay | Admitting: Family Medicine

## 2021-03-04 DIAGNOSIS — G479 Sleep disorder, unspecified: Secondary | ICD-10-CM

## 2021-03-06 NOTE — Telephone Encounter (Signed)
Is this okay to refill? 

## 2021-03-17 ENCOUNTER — Telehealth: Payer: Self-pay | Admitting: Family Medicine

## 2021-03-17 NOTE — Telephone Encounter (Signed)
Pt is requesting refill on [facility]derall [facility] [address]

## 2021-03-18 MED ORDER — AMPHETAMINE-DEXTROAMPHET ER 20 MG PO CP24: 20.0000 mg | ORAL_CAPSULE | Freq: Every day | ORAL | 0 refills | Status: DC

## 2021-04-04 DIAGNOSIS — S92355A Nondisplaced fracture of fifth metatarsal bone, left foot, initial encounter for closed fracture: Secondary | ICD-10-CM | POA: Diagnosis not present

## 2021-04-20 DIAGNOSIS — M1812 Unilateral primary osteoarthritis of first carpometacarpal joint, left hand: Secondary | ICD-10-CM | POA: Diagnosis not present

## 2021-04-20 DIAGNOSIS — M65322 Trigger finger, left index finger: Secondary | ICD-10-CM | POA: Diagnosis not present

## 2021-05-11 ENCOUNTER — Telehealth: Payer: Self-pay

## 2021-05-11 DIAGNOSIS — M79672 Pain in left foot: Secondary | ICD-10-CM | POA: Diagnosis not present

## 2021-05-11 DIAGNOSIS — M722 Plantar fascial fibromatosis: Secondary | ICD-10-CM | POA: Diagnosis not present

## 2021-05-11 DIAGNOSIS — S92355A Nondisplaced fracture of fifth metatarsal bone, left foot, initial encounter for closed fracture: Secondary | ICD-10-CM | POA: Diagnosis not present

## 2021-05-11 DIAGNOSIS — M79671 Pain in right foot: Secondary | ICD-10-CM | POA: Diagnosis not present

## 2021-05-11 MED ORDER — AMPHETAMINE-DEXTROAMPHET ER 20 MG PO CP24: 20.0000 mg | ORAL_CAPSULE | Freq: Every day | ORAL | 0 refills | Status: DC

## 2021-05-11 NOTE — Telephone Encounter (Signed)
Pt left message she needs [facility]

## 2021-06-21 ENCOUNTER — Telehealth: Payer: Self-pay | Admitting: Family Medicine

## 2021-06-21 MED ORDER — AMPHETAMINE-DEXTROAMPHET ER 20 MG PO CP24: 20.0000 mg | ORAL_CAPSULE | Freq: Every day | ORAL | 0 refills | Status: DC

## 2021-06-21 NOTE — Telephone Encounter (Signed)
Pt called in request for adderall refill  [facility] [address]

## 2021-06-27 ENCOUNTER — Ambulatory Visit (INDEPENDENT_AMBULATORY_CARE_PROVIDER_SITE_OTHER): Payer: PPO | Admitting: Family Medicine

## 2021-06-27 ENCOUNTER — Other Ambulatory Visit: Payer: Self-pay

## 2021-06-27 VITALS — BP 154/90 | HR 88 | Temp 97.6°F

## 2021-06-27 DIAGNOSIS — Z23 Encounter for immunization: Secondary | ICD-10-CM

## 2021-06-27 DIAGNOSIS — H6012 Cellulitis of left external ear: Secondary | ICD-10-CM | POA: Diagnosis not present

## 2021-06-27 MED ORDER — DOXYCYCLINE HYCLATE 100 MG PO TABS: 100.0000 mg | ORAL_TABLET | Freq: Two times a day (BID) | ORAL | 0 refills | Status: DC

## 2021-06-27 NOTE — Progress Notes (Signed)
   Subjective:    Patient ID: Annette Murray, female    DOB: 1956-06-18, 65 y.o.   MRN: [id number]  HPI She states that several days ago she noted an irritation at the base of her earlobe near the canal.  No drainage from the ear.  The swelling is gotten worse and now the entire ear is red and swollen and slightly tender.   Review of Systems     Objective:   Physical Exam Exam of the left ear does show erythema as well as swelling.  The external ear canal shows no particular lesions.  The canal is quite edematous and could not see the tympanic membrane.       Assessment & Plan:  Cellulitis of left ear - Plan: doxycycline (VIBRA-TABS) 100 MG tablet I will treat her with doxycycline to also cover possibility of MRSA.  She will keep me informed as to how she is doing.

## 2021-06-29 DIAGNOSIS — D3131 Benign neoplasm of right choroid: Secondary | ICD-10-CM | POA: Diagnosis not present

## 2021-07-27 DIAGNOSIS — M1812 Unilateral primary osteoarthritis of first carpometacarpal joint, left hand: Secondary | ICD-10-CM | POA: Diagnosis not present

## 2021-07-27 DIAGNOSIS — M65311 Trigger thumb, right thumb: Secondary | ICD-10-CM | POA: Diagnosis not present

## 2021-08-02 ENCOUNTER — Telehealth: Payer: Self-pay | Admitting: Family Medicine

## 2021-08-02 MED ORDER — AMPHETAMINE-DEXTROAMPHET ER 20 MG PO CP24: 20.0000 mg | ORAL_CAPSULE | Freq: Every day | ORAL | 0 refills | Status: DC

## 2021-08-02 NOTE — Telephone Encounter (Signed)
Pt left voice mail needs refill  [facility]all  [facility] [address]

## 2021-08-15 ENCOUNTER — Other Ambulatory Visit: Payer: Self-pay | Admitting: Family Medicine

## 2021-08-15 DIAGNOSIS — I7300 Raynaud's syndrome without gangrene: Secondary | ICD-10-CM

## 2021-08-15 DIAGNOSIS — I10 Essential (primary) hypertension: Secondary | ICD-10-CM

## 2021-08-29 ENCOUNTER — Encounter: Payer: Self-pay | Admitting: Family Medicine

## 2021-08-29 ENCOUNTER — Other Ambulatory Visit: Payer: Self-pay

## 2021-08-29 ENCOUNTER — Telehealth (INDEPENDENT_AMBULATORY_CARE_PROVIDER_SITE_OTHER): Payer: PPO | Admitting: Family Medicine

## 2021-08-29 VITALS — Temp 100.0°F | Wt 130.0 lb

## 2021-08-29 DIAGNOSIS — J0100 Acute maxillary sinusitis, unspecified: Secondary | ICD-10-CM

## 2021-08-29 DIAGNOSIS — J111 Influenza due to unidentified influenza virus with other respiratory manifestations: Secondary | ICD-10-CM | POA: Diagnosis not present

## 2021-08-29 MED ORDER — AMOXICILLIN-POT CLAVULANATE 875-125 MG PO TABS: 1.0000 | ORAL_TABLET | Freq: Two times a day (BID) | ORAL | 0 refills | Status: DC

## 2021-08-29 NOTE — Progress Notes (Signed)
   Subjective:    Patient ID: Annette Murray, female    DOB: 1956-02-01, 65 y.o.   MRN: [id number]  HPI Documentation for virtual audio and video telecommunications through [place] encounter: The patient was located at home. 2 patient identifiers used.  The provider was located in the office. The patient did consent to this visit and is aware of possible charges through their insurance for this visit. The other persons participating in this telemedicine service were none. Time spent on call was 4 minutes and in review of previous records >16 minutes total for counseling and coordination of care. This virtual service is not related to other E/M service within previous 7 days.  She was exposed to the flu last week and then apparently did get symptoms consistent with that with dry cough, fever, sinus congestion and fatigue.  She did do COVID testing which was negative.  She did start to get better until yesterday when she had increased sinus pressure, headache, upper tooth discomfort, increased fatigue and malaise.  Review of Systems     Objective:   Physical Exam Alert and in no distress but definitely fatigued looking.       Assessment & Plan:  Acute non-recurrent maxillary sinusitis - Plan: amoxicillin-clavulanate (AUGMENTIN) 875-125 MG tablet  Influenza I think she started out with influenza and then got a secondary bacterial infection.

## 2021-08-31 ENCOUNTER — Telehealth: Payer: PPO | Admitting: Family Medicine

## 2021-09-09 ENCOUNTER — Other Ambulatory Visit: Payer: Self-pay | Admitting: Family Medicine

## 2021-09-09 DIAGNOSIS — G479 Sleep disorder, unspecified: Secondary | ICD-10-CM

## 2021-09-11 NOTE — Telephone Encounter (Signed)
[facility] . Please advise [facility] 

## 2021-09-14 ENCOUNTER — Telehealth: Payer: Self-pay

## 2021-09-14 MED ORDER — AMPHETAMINE-DEXTROAMPHET ER 20 MG PO CP24: 20.0000 mg | ORAL_CAPSULE | Freq: Every day | ORAL | 0 refills | Status: DC

## 2021-09-14 NOTE — Telephone Encounter (Signed)
Pt left message needs refill [facility] XR [facility] [address]  

## 2021-10-10 DIAGNOSIS — Z23 Encounter for immunization: Secondary | ICD-10-CM | POA: Diagnosis not present

## 2021-10-12 DIAGNOSIS — Z01419 Encounter for gynecological examination (general) (routine) without abnormal findings: Secondary | ICD-10-CM | POA: Diagnosis not present

## 2021-10-12 DIAGNOSIS — N950 Postmenopausal bleeding: Secondary | ICD-10-CM | POA: Diagnosis not present

## 2021-10-12 DIAGNOSIS — N760 Acute vaginitis: Secondary | ICD-10-CM | POA: Diagnosis not present

## 2021-10-12 DIAGNOSIS — Z6823 Body mass index (BMI) 23.0-23.9, adult: Secondary | ICD-10-CM | POA: Diagnosis not present

## 2021-10-16 DIAGNOSIS — N950 Postmenopausal bleeding: Secondary | ICD-10-CM | POA: Diagnosis not present

## 2021-10-20 ENCOUNTER — Encounter: Payer: BC Managed Care – PPO | Admitting: Family Medicine

## 2021-10-27 ENCOUNTER — Ambulatory Visit (INDEPENDENT_AMBULATORY_CARE_PROVIDER_SITE_OTHER): Payer: PPO | Admitting: Family Medicine

## 2021-10-27 ENCOUNTER — Encounter: Payer: Self-pay | Admitting: Family Medicine

## 2021-10-27 ENCOUNTER — Other Ambulatory Visit: Payer: Self-pay

## 2021-10-27 VITALS — BP 126/82 | HR 68 | Temp 97.9°F | Ht 61.5 in | Wt 126.8 lb

## 2021-10-27 DIAGNOSIS — I7300 Raynaud's syndrome without gangrene: Secondary | ICD-10-CM | POA: Diagnosis not present

## 2021-10-27 DIAGNOSIS — Z800 Family history of malignant neoplasm of digestive organs: Secondary | ICD-10-CM

## 2021-10-27 DIAGNOSIS — Z8249 Family history of ischemic heart disease and other diseases of the circulatory system: Secondary | ICD-10-CM | POA: Diagnosis not present

## 2021-10-27 DIAGNOSIS — Z96653 Presence of artificial knee joint, bilateral: Secondary | ICD-10-CM | POA: Diagnosis not present

## 2021-10-27 DIAGNOSIS — Z0000 Encounter for general adult medical examination without abnormal findings: Secondary | ICD-10-CM

## 2021-10-27 DIAGNOSIS — G479 Sleep disorder, unspecified: Secondary | ICD-10-CM

## 2021-10-27 DIAGNOSIS — E785 Hyperlipidemia, unspecified: Secondary | ICD-10-CM

## 2021-10-27 DIAGNOSIS — I341 Nonrheumatic mitral (valve) prolapse: Secondary | ICD-10-CM

## 2021-10-27 DIAGNOSIS — Z23 Encounter for immunization: Secondary | ICD-10-CM

## 2021-10-27 DIAGNOSIS — I10 Essential (primary) hypertension: Secondary | ICD-10-CM

## 2021-10-27 DIAGNOSIS — B001 Herpesviral vesicular dermatitis: Secondary | ICD-10-CM

## 2021-10-27 DIAGNOSIS — F901 Attention-deficit hyperactivity disorder, predominantly hyperactive type: Secondary | ICD-10-CM

## 2021-10-27 MED ORDER — AMPHETAMINE-DEXTROAMPHET ER 20 MG PO CP24: 20.0000 mg | ORAL_CAPSULE | Freq: Every day | ORAL | 0 refills | Status: DC

## 2021-10-27 MED ORDER — TRAZODONE HCL 50 MG PO TABS: 50.0000 mg | ORAL_TABLET | Freq: Every day | ORAL | 3 refills | Status: DC

## 2021-10-27 MED ORDER — VALACYCLOVIR HCL 500 MG PO TABS: ORAL_TABLET | ORAL | 3 refills | Status: DC

## 2021-10-27 MED ORDER — DILTIAZEM HCL ER COATED BEADS 120 MG PO CP24: 120.0000 mg | ORAL_CAPSULE | Freq: Every day | ORAL | 3 refills | Status: DC

## 2021-10-27 NOTE — Progress Notes (Signed)
Annette Murray is a 66 y.o. female who presents for welcome to Medicare visit and follow-up on chronic medical conditions.  She has no major concerns or complaints. She does have underlying ADD and gets roughly 8 hours of benefit out of this.  She knows not to take it too late in the morning as it will interfere with his sleep.  She is using trazodone with her sleep and has had difficulty recently because of illnesses and injuries interfering with her sleep pattern.  She does have a history of mitral valve prolapse and there is also family history of heart disease.  She has had a coronary calcium score done which was 0.  She continues on diltiazem as well as HCTZ and having no difficulty with that.  Presently she is not taking any statins. She does have a family history of colon cancer and is on a 5-year schedule for colonoscopy.  She uses Valtrex fairly regularly for control of her herpes.  She notes that if she does not take the medicine she will start to have an outbreak.  She has had bilateral TKR and is done quite nicely with that.  She does have a history of Raynaud's disease but has very little difficulty with that especially with the use of diltiazem. Immunizations and Health Maintenance Immunization History  Administered Date(s) Administered   Fluad Quad(high Dose 65+) 06/27/2021   Hepatitis B 07/01/1994   Influenza Split 07/10/2012, 07/10/2013, 08/18/2014, 07/16/2015   Influenza Whole 05/30/2011   Influenza,inj,Quad PF,6+ Mos 06/16/2018, 07/07/2019   Influenza,inj,quad, With Preservative 07/01/2017   Influenza-Unspecified 08/03/2016, 07/18/2020   PFIZER Comirnaty(Gray Top)Covid-19 Tri-Sucrose Vaccine 03/22/2020, 04/12/2020, 10/09/2020, 04/14/2021   Pneumococcal Polysaccharide-23 10/27/2021   Pneumococcal-Unspecified 10/01/2013   Tdap 10/01/2009, 05/22/2010, 10/19/2020   Unspecified SARS-COV-2 Vaccination 10/02/2021   Zoster Recombinat (Shingrix) 05/20/2017, 11/13/2017   Zoster, Live  10/21/2015   There are no preventive care reminders to display for this patient.   Last Pap smear:last year Last mammogram:05/2020 Last colonoscopy:2017 Last DEXA:2022 WNL Dentist:Annette Murray eye Ophtho:Annette Murray  Exercise: 2-3 times per week for various exercise  Other doctors caring for patient include: Dr. Corinna Murray for GYN Dr. Dorann Murray Advanced directives: Does Patient Have a Medical Advance Directive?: Yes Type of Advance Directive: Living will Does patient want to make changes to medical advance directive?: No - Patient declined  Depression screen:  See questionnaire below.  Depression screen [facility] 2/9 10/27/2021 10/19/2020 07/07/2019 05/20/2017 08/26/2013  Decreased Interest 0 0 0 0 0  Down, Depressed, Hopeless 0 0 0 0 0  PHQ - 2 Score 0 0 0 0 0    Fall Risk Screen: see questionnaire below. Fall Risk  10/27/2021 07/07/2019 05/20/2017 08/26/2013  Falls in the past year? 1 0 No No  Number falls in past yr: 0 0 - -  Injury with Fall? 0 0 - -  Risk for fall due to : No Fall Risks - - -  Follow up Falls evaluation completed - - -    ADL screen:  See questionnaire below Functional Status Survey: Is the patient deaf or have difficulty hearing?: No Does the patient have difficulty seeing, even when wearing glasses/contacts?: No Does the patient have difficulty concentrating, remembering, or making decisions?: No Does the patient have difficulty walking or climbing stairs?: No Does the patient have difficulty dressing or bathing?: No Does the patient have difficulty doing errands alone such as visiting a doctor's office or shopping?: No   Review of Systems Constitutional: -, -unexpected weight change, -  anorexia, -fatigue Allergy: -sneezing, -itching, -congestion Dermatology: denies changing moles, rash, lumps ENT: -runny nose, -ear pain, -sore throat,  Cardiology:  -chest pain, -palpitations, -orthopnea, Respiratory: -cough, -shortness of breath, -dyspnea on exertion, -wheezing,   Gastroenterology: -abdominal pain, -nausea, -vomiting, -diarrhea, -constipation, -dysphagia Hematology: -bleeding or bruising problems Musculoskeletal: -arthralgias, -myalgias, -joint swelling, -back pain, - Ophthalmology: -vision changes,  Urology: -dysuria, -difficulty urinating,  -urinary frequency, -urgency, incontinence Neurology: -, -numbness, , -memory loss, -falls, -dizziness    PHYSICAL EXAM:  BP 126/82    Pulse 68    Temp 97.9 F (36.6 C)    Ht 5' 1.5" (1.562 m)    Wt 126 lb 12.8 oz (57.5 kg)    SpO2 99%    BMI 23.57 kg/m   General Appearance: Alert, cooperative, no distress, appears stated age Head: Normocephalic, without obvious abnormality, atraumatic Eyes: PERRL, conjunctiva/corneas clear, EOM's intact,  Ears: Normal TM's and external ear canals Nose: Nares normal, mucosa normal, no drainage or sinus tenderness Throat: Lips, mucosa, and tongue normal; teeth and gums normal Neck: Supple, no lymphadenopathy;  thyroid:  no enlargement/tenderness/nodules; no carotid bruit or JVD Lungs: Clear to auscultation bilaterally without wheezes, rales or ronchi; respirations unlabored Heart: Regular rate and rhythm, S1 and S2 normal, no murmur, rubor gallop Abdomen: Soft, non-tender, nondistended, normoactive bowel sounds,  no masses, no hepatosplenomegaly Skin:  Skin color, texture, turgor normal, no rashes or lesions Lymph nodes: Cervical, supraclavicular, and axillary nodes normal Neurologic:  CNII-XII intact, normal strength, sensation and gait; reflexes 2+ and symmetric throughout Psych: Normal mood, affect, hygiene and grooming.  ASSESSMENT/PLAN: ADHD (attention deficit hyperactivity disorder), predominantly hyperactive impulsive type - Plan: amphetamine-dextroamphetamine (ADDERALL XR) 20 MG 24 hr capsule  Status post total bilateral knee replacement  Essential hypertension - Plan: diltiazem (CARDIZEM CD) 120 MG 24 hr capsule  Family history of colon cancer - Plan: CBC  with Differential/Platelet, Comprehensive metabolic panel  MVP (mitral valve prolapse)  Herpes labialis - Plan: valACYclovir (VALTREX) 500 MG tablet  Sleep disturbance - Plan: traZODone (DESYREL) 50 MG tablet  Raynaud's disease without gangrene - Plan: diltiazem (CARDIZEM CD) 120 MG 24 hr capsule  Hyperlipidemia LDL goal <100 - Plan: Lipid panel  Family history of heart disease - Father with MI at age 69 and CABG at age 25 x5 age 69 - Plan: CBC with Differential/Platelet, Comprehensive metabolic panel, Lipid panel  Need for vaccination for Strep pneumoniae - Plan: Pneumococcal polysaccharide vaccine 23-valent greater than or equal to 2yo subcutaneous/IM She will call me when she needs refills.   Discussed at least 30 minutes of aerobic activity at least 5 days/week and weight-bearing exercise 2x/week; .  Immunization recommendations discussed.  Colonoscopy recommendations reviewed   Medicare Attestation I have personally reviewed: The patient's medical and social history Their use of alcohol, tobacco or illicit drugs Their current medications and supplements The patient's functional ability including ADLs,fall risks, home safety risks, cognitive, and hearing and visual impairment Diet and physical activities Evidence for depression or mood disorders  The patient's weight, height, and BMI have been recorded in the chart.  I have made referrals, counseling, and provided education to the patient based on review of the above and I have provided the patient with a written personalized care plan for preventive services.     Jill Alexanders, MD   10/27/2021

## 2021-10-27 NOTE — Patient Instructions (Signed)
°  Annette Murray , Thank you for taking time to come for your Medicare Wellness Visit. I appreciate your ongoing commitment to your health goals. Please review the following plan we discussed and let me know if I can assist you in the future.   These are the goals we discussed:  Goals   None     This is a list of the screening recommended for you and due dates:  Health Maintenance  Topic Date Due   HIV Screening  10/27/2022*   COVID-19 Vaccine (6 - Booster for Pfizer series) 11/27/2021   Mammogram  05/09/2022   Pneumonia Vaccine (2 - PCV) 10/27/2022   Pap Smear  06/24/2023   Colon Cancer Screening  08/20/2026   Tetanus Vaccine  10/19/2030   Flu Shot  Completed   DEXA scan (bone density measurement)  Completed   Hepatitis C Screening: USPSTF Recommendation to screen - Ages 18-79 yo.  Completed   Zoster (Shingles) Vaccine  Completed   HPV Vaccine  Aged Out  *Topic was postponed. The date shown is not the original due date.

## 2021-10-28 LAB — LIPID PANEL
Chol/HDL Ratio: 2.8 ratio (ref 0.0–4.4)
Cholesterol, Total: 279 mg/dL — ABNORMAL HIGH (ref 100–199)
HDL: 98 mg/dL (ref >39)
LDL Chol Calc (NIH): 146 mg/dL — ABNORMAL HIGH (ref 0–99)
Triglycerides: 201 mg/dL — ABNORMAL HIGH (ref 0–149)
VLDL Cholesterol Cal: 35 mg/dL (ref 5–40)

## 2021-10-28 LAB — CBC WITH DIFFERENTIAL/PLATELET
Basophils Absolute: 0 10*3/uL (ref 0.0–0.2)
Basos: 0 %
EOS (ABSOLUTE): 0 10*3/uL (ref 0.0–0.4)
Eos: 1 %
Hematocrit: 42.5 % (ref 34.0–46.6)
Hemoglobin: 15 g/dL (ref 11.1–15.9)
Immature Grans (Abs): 0 10*3/uL (ref 0.0–0.1)
Immature Granulocytes: 0 %
Lymphocytes Absolute: 1.5 10*3/uL (ref 0.7–3.1)
Lymphs: 37 %
MCH: 35.9 pg — ABNORMAL HIGH (ref 26.6–33.0)
MCHC: 35.3 g/dL (ref 31.5–35.7)
MCV: 102 fL — ABNORMAL HIGH (ref 79–97)
Monocytes Absolute: 0.4 10*3/uL (ref 0.1–0.9)
Monocytes: 10 %
Neutrophils Absolute: 2.1 10*3/uL (ref 1.4–7.0)
Neutrophils: 52 %
Platelets: 207 10*3/uL (ref 150–450)
RBC: 4.18 x10E6/uL (ref 3.77–5.28)
RDW: 12 % (ref 11.7–15.4)
WBC: 4.1 10*3/uL (ref 3.4–10.8)

## 2021-10-28 LAB — COMPREHENSIVE METABOLIC PANEL
ALT: 22 IU/L (ref 0–32)
AST: 23 IU/L (ref 0–40)
Albumin/Globulin Ratio: 2.5 — ABNORMAL HIGH (ref 1.2–2.2)
Albumin: 4.8 g/dL (ref 3.8–4.8)
Alkaline Phosphatase: 111 IU/L (ref 44–121)
BUN/Creatinine Ratio: 10 — ABNORMAL LOW (ref 12–28)
BUN: 8 mg/dL (ref 8–27)
Bilirubin Total: 0.6 mg/dL (ref 0.0–1.2)
CO2: 28 mmol/L (ref 20–29)
Calcium: 10.1 mg/dL (ref 8.7–10.3)
Chloride: 99 mmol/L (ref 96–106)
Creatinine, Ser: 0.79 mg/dL (ref 0.57–1.00)
Globulin, Total: 1.9 g/dL (ref 1.5–4.5)
Glucose: 94 mg/dL (ref 70–99)
Potassium: 4.9 mmol/L (ref 3.5–5.2)
Sodium: 140 mmol/L (ref 134–144)
Total Protein: 6.7 g/dL (ref 6.0–8.5)
eGFR: 83 mL/min/{1.73_m2} (ref >59)

## 2021-11-08 DIAGNOSIS — Z1231 Encounter for screening mammogram for malignant neoplasm of breast: Secondary | ICD-10-CM | POA: Diagnosis not present

## 2021-11-23 DIAGNOSIS — M1812 Unilateral primary osteoarthritis of first carpometacarpal joint, left hand: Secondary | ICD-10-CM | POA: Diagnosis not present

## 2021-11-23 DIAGNOSIS — M65311 Trigger thumb, right thumb: Secondary | ICD-10-CM | POA: Diagnosis not present

## 2021-12-14 ENCOUNTER — Telehealth: Payer: Self-pay | Admitting: Family Medicine

## 2021-12-14 MED ORDER — AMPHETAMINE-DEXTROAMPHET ER 20 MG PO CP24: 20.0000 mg | ORAL_CAPSULE | Freq: Every day | ORAL | 0 refills | Status: DC

## 2021-12-14 NOTE — Telephone Encounter (Signed)
Pt called for refills of adderall xr. [facility] [address]  ?

## 2021-12-20 DIAGNOSIS — M19032 Primary osteoarthritis, left wrist: Secondary | ICD-10-CM | POA: Diagnosis not present

## 2021-12-20 DIAGNOSIS — M65311 Trigger thumb, right thumb: Secondary | ICD-10-CM | POA: Diagnosis not present

## 2021-12-20 DIAGNOSIS — M19042 Primary osteoarthritis, left hand: Secondary | ICD-10-CM | POA: Diagnosis not present

## 2022-01-22 ENCOUNTER — Telehealth: Payer: Self-pay

## 2022-01-22 DIAGNOSIS — F901 Attention-deficit hyperactivity disorder, predominantly hyperactive type: Secondary | ICD-10-CM

## 2022-01-22 MED ORDER — AMPHETAMINE-DEXTROAMPHET ER 20 MG PO CP24: 20.0000 mg | ORAL_CAPSULE | Freq: Every day | ORAL | 0 refills | Status: DC

## 2022-01-22 NOTE — Addendum Note (Signed)
Addended by: Denita Lung on: 01/22/2022 01:11 PM ? ? Modules accepted: Orders ? ?

## 2022-01-22 NOTE — Telephone Encounter (Signed)
Pt is requesting refill on Adderall XR [facility] [address] ?

## 2022-02-27 ENCOUNTER — Telehealth: Payer: Self-pay

## 2022-02-27 DIAGNOSIS — F901 Attention-deficit hyperactivity disorder, predominantly hyperactive type: Secondary | ICD-10-CM

## 2022-02-27 MED ORDER — AMPHETAMINE-DEXTROAMPHET ER 20 MG PO CP24: 20.0000 mg | ORAL_CAPSULE | Freq: Every day | ORAL | 0 refills | Status: DC

## 2022-02-27 NOTE — Telephone Encounter (Signed)
Pt. Called LM stating that she needs a refill on her Adderall last apt was 10/27/21 next apt 11/01/22.

## 2022-03-30 ENCOUNTER — Telehealth: Payer: Self-pay | Admitting: Family Medicine

## 2022-03-30 DIAGNOSIS — F901 Attention-deficit hyperactivity disorder, predominantly hyperactive type: Secondary | ICD-10-CM

## 2022-03-30 MED ORDER — AMPHETAMINE-DEXTROAMPHET ER 20 MG PO CP24: 20.0000 mg | ORAL_CAPSULE | Freq: Every day | ORAL | 0 refills | Status: DC

## 2022-03-30 NOTE — Telephone Encounter (Signed)
Pt called and left a Vm that she needs a refill on her adderall xr please [facility] to [id number] [place], [place] - [address]

## 2022-05-08 ENCOUNTER — Telehealth: Payer: Self-pay | Admitting: Family Medicine

## 2022-05-08 DIAGNOSIS — F901 Attention-deficit hyperactivity disorder, predominantly hyperactive type: Secondary | ICD-10-CM

## 2022-05-08 MED ORDER — AMPHETAMINE-DEXTROAMPHET ER 20 MG PO CP24: 20.0000 mg | ORAL_CAPSULE | Freq: Every day | ORAL | 0 refills | Status: DC

## 2022-05-08 NOTE — Telephone Encounter (Signed)
Pt called and requested refills on Adderall XR. [facility] to [facility] [address].

## 2022-05-09 ENCOUNTER — Other Ambulatory Visit: Payer: Self-pay | Admitting: Family Medicine

## 2022-05-09 DIAGNOSIS — I10 Essential (primary) hypertension: Secondary | ICD-10-CM

## 2022-05-24 DIAGNOSIS — Z86010 Personal history of colonic polyps: Secondary | ICD-10-CM | POA: Diagnosis not present

## 2022-05-24 DIAGNOSIS — Z800 Family history of malignant neoplasm of digestive organs: Secondary | ICD-10-CM | POA: Diagnosis not present

## 2022-05-24 DIAGNOSIS — K5730 Diverticulosis of large intestine without perforation or abscess without bleeding: Secondary | ICD-10-CM | POA: Diagnosis not present

## 2022-05-24 DIAGNOSIS — K649 Unspecified hemorrhoids: Secondary | ICD-10-CM | POA: Diagnosis not present

## 2022-05-24 DIAGNOSIS — Z09 Encounter for follow-up examination after completed treatment for conditions other than malignant neoplasm: Secondary | ICD-10-CM | POA: Diagnosis not present

## 2022-05-24 DIAGNOSIS — D122 Benign neoplasm of ascending colon: Secondary | ICD-10-CM | POA: Diagnosis not present

## 2022-05-24 LAB — HM COLONOSCOPY

## 2022-05-28 DIAGNOSIS — D122 Benign neoplasm of ascending colon: Secondary | ICD-10-CM | POA: Diagnosis not present

## 2022-06-06 ENCOUNTER — Encounter: Payer: Self-pay | Admitting: Internal Medicine

## 2022-06-13 ENCOUNTER — Encounter: Payer: Self-pay | Admitting: Family Medicine

## 2022-06-13 DIAGNOSIS — D126 Benign neoplasm of colon, unspecified: Secondary | ICD-10-CM

## 2022-06-15 ENCOUNTER — Telehealth: Payer: Self-pay | Admitting: Family Medicine

## 2022-06-15 DIAGNOSIS — F901 Attention-deficit hyperactivity disorder, predominantly hyperactive type: Secondary | ICD-10-CM

## 2022-06-15 MED ORDER — AMPHETAMINE-DEXTROAMPHET ER 20 MG PO CP24: 20.0000 mg | ORAL_CAPSULE | Freq: Every day | ORAL | 0 refills | Status: DC

## 2022-06-15 NOTE — Telephone Encounter (Signed)
Pt left message needs refill [facility] XR [facility] [address]  

## 2022-06-21 DIAGNOSIS — M65311 Trigger thumb, right thumb: Secondary | ICD-10-CM | POA: Diagnosis not present

## 2022-06-21 DIAGNOSIS — M1812 Unilateral primary osteoarthritis of first carpometacarpal joint, left hand: Secondary | ICD-10-CM | POA: Diagnosis not present

## 2022-07-21 DIAGNOSIS — M79672 Pain in left foot: Secondary | ICD-10-CM | POA: Diagnosis not present

## 2022-07-23 ENCOUNTER — Encounter: Payer: Self-pay | Admitting: Internal Medicine

## 2022-08-01 ENCOUNTER — Telehealth: Payer: Self-pay | Admitting: Family Medicine

## 2022-08-01 DIAGNOSIS — F901 Attention-deficit hyperactivity disorder, predominantly hyperactive type: Secondary | ICD-10-CM

## 2022-08-01 NOTE — Telephone Encounter (Signed)
Pt left message needs refill [facility] XR [facility]20mg[facility]  [facility]

## 2022-08-02 DIAGNOSIS — S92355A Nondisplaced fracture of fifth metatarsal bone, left foot, initial encounter for closed fracture: Secondary | ICD-10-CM | POA: Diagnosis not present

## 2022-08-02 MED ORDER — AMPHETAMINE-DEXTROAMPHET ER 20 MG PO CP24: 20.0000 mg | ORAL_CAPSULE | Freq: Every day | ORAL | 0 refills | Status: DC

## 2022-08-31 DIAGNOSIS — S92355A Nondisplaced fracture of fifth metatarsal bone, left foot, initial encounter for closed fracture: Secondary | ICD-10-CM | POA: Diagnosis not present

## 2022-09-07 ENCOUNTER — Telehealth: Payer: Self-pay | Admitting: Family Medicine

## 2022-09-07 DIAGNOSIS — F901 Attention-deficit hyperactivity disorder, predominantly hyperactive type: Secondary | ICD-10-CM

## 2022-09-07 NOTE — Telephone Encounter (Signed)
Pt left message [facility]

## 2022-09-08 MED ORDER — AMPHETAMINE-DEXTROAMPHET ER 20 MG PO CP24: 20.0000 mg | ORAL_CAPSULE | Freq: Every day | ORAL | 0 refills | Status: DC

## 2022-09-10 ENCOUNTER — Other Ambulatory Visit: Payer: Self-pay | Admitting: Medical

## 2022-09-10 DIAGNOSIS — F901 Attention-deficit hyperactivity disorder, predominantly hyperactive type: Secondary | ICD-10-CM

## 2022-09-12 DIAGNOSIS — S92353A Displaced fracture of fifth metatarsal bone, unspecified foot, initial encounter for closed fracture: Secondary | ICD-10-CM | POA: Insufficient documentation

## 2022-10-08 DIAGNOSIS — D3131 Benign neoplasm of right choroid: Secondary | ICD-10-CM | POA: Diagnosis not present

## 2022-10-17 ENCOUNTER — Telehealth: Payer: Self-pay | Admitting: Family Medicine

## 2022-10-17 DIAGNOSIS — F901 Attention-deficit hyperactivity disorder, predominantly hyperactive type: Secondary | ICD-10-CM

## 2022-10-17 MED ORDER — AMPHETAMINE-DEXTROAMPHET ER 20 MG PO CP24: 20.0000 mg | ORAL_CAPSULE | Freq: Every day | ORAL | 0 refills | Status: DC

## 2022-10-17 NOTE — Telephone Encounter (Signed)
Pt left VM requesting refill for her adderall xr please send to [id number] [place], [place] - [address]

## 2022-10-30 ENCOUNTER — Ambulatory Visit (INDEPENDENT_AMBULATORY_CARE_PROVIDER_SITE_OTHER): Payer: PPO

## 2022-10-30 VITALS — Ht 62.0 in | Wt 128.0 lb

## 2022-10-30 DIAGNOSIS — Z0000 Encounter for general adult medical examination without abnormal findings: Secondary | ICD-10-CM | POA: Diagnosis not present

## 2022-10-30 NOTE — Progress Notes (Signed)
I connected with [name] today by telephone and verified that I am speaking with the correct person using two identifiers. Location patient: home Location provider: work Persons participating in the virtual visit: [name], [name].   I discussed the limitations, risks, security and privacy concerns of performing an evaluation and management service by telephone and the availability of in person appointments. I also discussed with the patient that there may be a patient responsible charge related to this service. The patient expressed understanding and verbally consented to this telephonic visit.    Interactive audio and video telecommunications were attempted between this provider and patient, however failed, due to patient having technical difficulties OR patient did not have access to video capability.  We continued and completed visit with audio only.     Vital signs may be patient reported or missing.  Subjective:   Annette Murray is a 68 y.o. female who presents for an Initial Medicare Annual Wellness Visit.  Review of Systems     Cardiac Risk Factors include: advanced age (>24mn, >>57women);dyslipidemia;hypertension     Objective:    Today's Vitals   10/30/22 1436 10/30/22 1437  Weight: 128 lb (58.1 kg)   Height: '5\' 2"'$  (1.575 m)   PainSc:  8    Body mass index is 23.41 kg/m.     10/30/2022    2:42 PM 10/27/2021    9:36 AM 07/10/2018    5:00 PM 07/10/2018   10:25 AM 07/02/2018    4:16 PM 04/28/2012    2:30 PM 04/21/2012   10:31 AM  Advanced Directives  Does Patient Have a Medical Advance Directive? Yes Yes Yes Yes Yes Patient has advance directive, copy in chart Patient has advance directive, copy in chart  Type of Advance Directive HPorterLiving will Living will HNorcoLiving will HTrommaldLiving will HSteenLiving will HFranklin  Does  patient want to make changes to medical advance directive?  No - Patient declined No - Patient declined No - Patient declined No - Patient declined    Copy of HSeverancein Chart? Yes - validated most recent copy scanned in chart (See row information)  Yes Yes Yes    Pre-existing out of facility DNR order (yellow form or pink MOST form)      No No    Current Medications (verified) Outpatient Encounter Medications as of 10/30/2022  Medication Sig   amphetamine-dextroamphetamine (ADDERALL XR) 20 MG 24 hr capsule Take 1 capsule (20 mg total) by mouth daily.   aspirin EC 81 MG tablet Take 1 tablet (81 mg total) by mouth daily. Swallow whole.   diclofenac Sodium (VOLTAREN) 1 % GEL Apply topically.   diltiazem (CARDIZEM CD) 120 MG 24 hr capsule Take 1 capsule (120 mg total) by mouth daily.   fluticasone (FLONASE) 50 MCG/ACT nasal spray Place 1 spray into both nostrils at bedtime as needed for allergies or rhinitis.   hydrochlorothiazide (MICROZIDE) 12.5 MG capsule TAKE 1 CAPSULE BY MOUTH EVERY DAY   hydroquinone 4 % cream APPLY TO AFFECTED AREA TWICE A DAY   Multiple Vitamin (MULTIVITAMIN WITH MINERALS) TABS tablet Take 1 tablet by mouth daily.   Multiple Vitamins-Minerals (ZINC PO) zinc   Nutritional Supplements (VITAMIN D BOOSTER PO) Vitamin D   traZODone (DESYREL) 50 MG tablet Take 1 tablet (50 mg total) by mouth at bedtime.   valACYclovir (VALTREX) 500 MG tablet TAKE 1  TABLET BY MOUTH EVERY MORNING.   vitamin k 100 MCG tablet    amoxicillin-clavulanate (AUGMENTIN) 875-125 MG tablet Take 1 tablet by mouth 2 (two) times daily. (Patient not taking: Reported on 10/27/2021)   PREMARIN vaginal cream INSERT 0.5 APPLICATOR(S)FUL TWICE A WEEK BY VAGINAL ROUTE. (Patient not taking: Reported on 10/27/2021)   rosuvastatin (CRESTOR) 5 MG tablet Take 1 tablet (5 mg total) by mouth daily. (Patient not taking: Reported on 10/27/2021)   No facility-administered encounter medications on file as of  10/30/2022.    Allergies (verified) Codeine   History: Past Medical History:  Diagnosis Date   ADD (attention deficit disorder)    Allergy    RHINITIS   Arthritis    Complication of anesthesia    itching    FH: colonic polyps    Heart murmur    MVP   Herpes labialis    Hypertension    borderline   MVP (mitral valve prolapse)    Pneumonia    hx of    Vitiligo    Past Surgical History:  Procedure Laterality Date   KNEE ARTHROPLASTY Left 07/10/2018   Procedure: LEFT TOTAL KNEE ARTHROPLASTY WITH COMPUTER NAVIGATION;  Surgeon: Rod Can, MD;  Location: [facility];  Service: Orthopedics;  Laterality: Left;  block   KNEE ARTHROSCOPY     right x 2   right knee re built 15 years ago   TONSILLECTOMY     TOTAL KNEE ARTHROPLASTY  04/28/2012   Procedure: TOTAL KNEE ARTHROPLASTY;  Surgeon: Gearlean Alf, MD;  Location: [facility];  Service: Orthopedics;  Laterality: Right;   TUBAL LIGATION     Family History  Problem Relation Age of Onset   Arthritis Mother    Asthma Mother    Breast cancer Maternal Aunt    Social History   Socioeconomic History   Marital status: Married    Spouse name: Not on file   Number of children: Not on file   Years of education: Not on file   Highest education level: Not on file  Occupational History   Not on file  Tobacco Use   Smoking status: Never   Smokeless tobacco: Never  Vaping Use   Vaping Use: Never used  Substance and Sexual Activity   Alcohol use: Yes    Alcohol/week: 2.0 standard drinks of alcohol    Types: 1 Cans of beer, 1 Shots of liquor per week    Comment: 2- drimks daily   Drug use: No   Sexual activity: Yes  Other Topics Concern   Not on file  Social History Narrative   Not on file   Social Determinants of Health   Financial Resource Strain: Low Risk  (10/30/2022)   Overall Financial Resource Strain (CARDIA)    Difficulty of Paying Living Expenses: Not hard at all  Food Insecurity: No Food Insecurity (10/30/2022)    Hunger Vital Sign    Worried About Running Out of Food in the Last Year: Never true    Ran Out of Food in the Last Year: Never true  Transportation Needs: No Transportation Needs (10/30/2022)   PRAPARE - Hydrologist (Medical): No    Lack of Transportation (Non-Medical): No  Physical Activity: Insufficiently Active (10/30/2022)   Exercise Vital Sign    Days of Exercise per Week: 2 days    Minutes of Exercise per Session: 30 min  Stress: No Stress Concern Present (10/30/2022)   [facility]  Stress Questionnaire    Feeling of Stress : Not at all  Social Connections: Not on file    Tobacco Counseling Counseling given: Not Answered   Clinical Intake:  Pre-visit preparation completed: Yes  Pain : 0-10 Pain Score: 8  Pain Type: Chronic pain Pain Location: Finger (Comment which one) Pain Orientation: Right Pain Descriptors / Indicators: Aching Pain Onset: More than a month ago Pain Frequency: Constant     Nutritional Status: BMI of 19-24  Normal Nutritional Risks: None Diabetes: No  How often do you need to have someone help you when you read instructions, pamphlets, or other written materials from your doctor or pharmacy?: 1 - Never  Diabetic? no  Interpreter Needed?: No  Information entered by :: NAllen LPN   Activities of Daily Living    10/30/2022    2:44 PM  In your present state of health, do you have any difficulty performing the following activities:  Hearing? 0  Vision? 0  Difficulty concentrating or making decisions? 0  Walking or climbing stairs? 0  Dressing or bathing? 0  Doing errands, shopping? 0  Preparing Food and eating ? N  Using the Toilet? N  In the past six months, have you accidently leaked urine? Y  Do you have problems with loss of bowel control? N  Managing your Medications? N  Managing your Finances? N  Housekeeping or managing your Housekeeping? N    Patient Care  Team: Denita Lung, MD as PCP - General (Family Medicine) Dorothy Spark, MD as PCP - Cardiology (Cardiology)  Indicate any recent Medical Services you may have received from other than [facility] providers in the past year (date may be approximate).     Assessment:   This is a routine wellness examination for [place].  Hearing/Vision screen Vision Screening - Comments:: Regular eye exams, [facility]  Dietary issues and exercise activities discussed: Current Exercise Habits: Home exercise routine, Type of exercise: walking, Time (Minutes): 30, Frequency (Times/Week): 2, Weekly Exercise (Minutes/Week): 60   Goals Addressed             This Visit's Progress    Patient Stated       10/30/2022, wants to get in shape       Depression Screen    10/30/2022    2:43 PM 10/27/2021    9:37 AM 10/19/2020   11:04 AM 07/07/2019   11:56 AM 05/20/2017    3:02 PM 08/26/2013    1:54 PM  PHQ 2/9 Scores  PHQ - 2 Score 0 0 0 0 0 0  PHQ- 9 Score 3         Fall Risk    10/30/2022    2:43 PM 10/27/2021    9:36 AM 07/07/2019   11:56 AM 05/20/2017    3:02 PM 08/26/2013    1:54 PM  [place] in the past year? 0 1 0 No No  Number falls in past yr: 0 0 0    Injury with Fall? 0 0 0    Risk for fall due to : Medication side effect No Fall Risks     Follow up Falls prevention discussed;Education provided;Falls evaluation completed Falls evaluation completed       FALL RISK PREVENTION PERTAINING TO THE HOME:  Any stairs in or around the home? Yes  If so, are there any without handrails? No  Home free of loose throw rugs in walkways, pet beds, electrical cords, etc? Yes  Adequate  lighting in your home to reduce risk of falls? Yes   ASSISTIVE DEVICES UTILIZED TO PREVENT FALLS:  Life alert? No  Use of a cane, walker or w/c? No  Grab bars in the bathroom? No  Shower chair or bench in shower? Yes  Elevated toilet seat or a handicapped toilet? Yes   TIMED UP AND GO:  Was the  test performed? No .      Cognitive Function:        10/30/2022    2:45 PM  6CIT Screen  What Year? 0 points  What month? 0 points  What time? 0 points  Count back from 20 0 points  Months in reverse 0 points  Repeat phrase 0 points  Total Score 0 points    Immunizations Immunization History  Administered Date(s) Administered   Fluad Quad(high Dose 65+) 06/27/2021, 07/02/2022   Hepatitis B 07/01/1994   Influenza Split 07/10/2012, 07/10/2013, 08/18/2014, 07/16/2015   Influenza Whole 05/30/2011   Influenza,inj,Quad PF,6+ Mos 06/16/2018, 07/07/2019   Influenza,inj,quad, With Preservative 07/01/2017   Influenza-Unspecified 08/03/2016, 07/18/2020   PFIZER Comirnaty(Gray Top)Covid-19 Tri-Sucrose Vaccine 03/22/2020, 04/12/2020, 10/09/2020, 04/14/2021   Pneumococcal Polysaccharide-23 10/27/2021   Pneumococcal-Unspecified 10/01/2013   Respiratory Syncytial Virus Vaccine,Recomb Aduvanted(Arexvy) 08/07/2022   Tdap 10/01/2009, 05/22/2010, 10/19/2020   Unspecified SARS-COV-2 Vaccination 10/02/2021, 07/02/2022   Zoster Recombinat (Shingrix) 05/20/2017, 11/13/2017   Zoster, Live 10/21/2015    TDAP status: Up to date  Flu Vaccine status: Up to date  Pneumococcal vaccine status: Up to date  Covid-19 vaccine status: Completed vaccines  Qualifies for Shingles Vaccine? Yes   Zostavax completed Yes   Shingrix Completed?: Yes  Screening Tests Health Maintenance  Topic Date Due   MAMMOGRAM  05/09/2022   COVID-19 Vaccine (7 - 2023-24 season) 08/27/2022   Medicare Annual Wellness (AWV)  10/27/2022   Pneumonia Vaccine 89+ Years old (2 - PCV) 10/27/2022   DTaP/Tdap/Td (4 - Td or Tdap) 10/19/2030   COLONOSCOPY (Pts 45-55yr Insurance coverage will need to be confirmed)  05/24/2032   INFLUENZA VACCINE  Completed   DEXA SCAN  Completed   Hepatitis C Screening  Completed   Zoster Vaccines- Shingrix  Completed   HPV VACCINES  Aged Out    Health Maintenance  Health Maintenance  Due  Topic Date Due   MAMMOGRAM  05/09/2022   COVID-19 Vaccine (7 - 2023-24 season) 08/27/2022   Medicare Annual Wellness (AWV)  10/27/2022   Pneumonia Vaccine 67 Years old (2 - PCV) 10/27/2022    Colorectal cancer screening: Type of screening: Colonoscopy. Completed 05/24/2022. Repeat every 5 years  Mammogram status: scheduled for February  Bone Density status: scheduled for February  Lung Cancer Screening: (Low Dose CT Chest recommended if Age 695-80years, 30 pack-year currently smoking OR have quit w/in 15years.) does not qualify.   Lung Cancer Screening Referral: no  Additional Screening:  Hepatitis C Screening: does qualify; Completed 10/18/2015  Vision Screening: Recommended annual ophthalmology exams for early detection of glaucoma and other disorders of the eye. Is the patient up to date with their annual eye exam?  Yes  Who is the provider or what is the name of the office in which the patient attends annual eye exams[facility] If pt is not established with a provider, would they like to be referred to a provider to establish care? No .   Dental Screening: Recommended annual dental exams for proper oral hygiene  Community Resource Referral / Chronic Care Management: CRR required this visit?  No   CCM required this visit?  No      Plan:     I have personally reviewed and noted the following in the patient's chart:   Medical and social history Use of alcohol, tobacco or illicit drugs  Current medications and supplements including opioid prescriptions. Patient is not currently taking opioid prescriptions. Functional ability and status Nutritional status Physical activity Advanced directives List of other physicians Hospitalizations, surgeries, and ER visits in previous 12 months Vitals Screenings to include cognitive, depression, and falls Referrals and appointments  In addition, I have reviewed and discussed with patient certain preventive protocols,  quality metrics, and best practice recommendations. A written personalized care plan for preventive services as well as general preventive health recommendations were provided to patient.     Kellie Simmering, LPN   [id number]/46/2703   Nurse Notes: none  Due to this being a virtual visit, the after visit summary with patients personalized plan was offered to patient via mail or my-chart. Patient would like to access on my-chart

## 2022-10-30 NOTE — Patient Instructions (Signed)
Annette Murray , Thank you for taking time to come for your Medicare Wellness Visit. I appreciate your ongoing commitment to your health goals. Please review the following plan we discussed and let me know if I can assist you in the future.   These are the goals we discussed:  Goals      Patient Stated     10/30/2022, wants to get in shape        This is a list of the screening recommended for you and due dates:  Health Maintenance  Topic Date Due   Mammogram  05/09/2022   COVID-19 Vaccine (7 - 2023-24 season) 08/27/2022   Pneumonia Vaccine (2 - PCV) 10/27/2022   Medicare Annual Wellness Visit  10/31/2023   DTaP/Tdap/Td vaccine (4 - Td or Tdap) 10/19/2030   Colon Cancer Screening  05/24/2032   Flu Shot  Completed   DEXA scan (bone density measurement)  Completed   Hepatitis C Screening: USPSTF Recommendation to screen - Ages 72-79 yo.  Completed   Zoster (Shingles) Vaccine  Completed   HPV Vaccine  Aged Out    Advanced directives: copy in chart  Conditions/risks identified: none  Next appointment: Follow up in one year for your annual wellness visit    Preventive Care 65 Years and Older, Female Preventive care refers to lifestyle choices and visits with your health care provider that can promote health and wellness. What does preventive care include? A yearly physical exam. This is also called an annual well check. Dental exams once or twice a year. Routine eye exams. Ask your health care provider how often you should have your eyes checked. Personal lifestyle choices, including: Daily care of your teeth and gums. Regular physical activity. Eating a healthy diet. Avoiding tobacco and drug use. Limiting alcohol use. Practicing safe sex. Taking low-dose aspirin every day. Taking vitamin and mineral supplements as recommended by your health care provider. What happens during an annual well check? The services and screenings done by your health care provider during your  annual well check will depend on your age, overall health, lifestyle risk factors, and family history of disease. Counseling  Your health care provider may ask you questions about your: Alcohol use. Tobacco use. Drug use. Emotional well-being. Home and relationship well-being. Sexual activity. Eating habits. History of falls. Memory and ability to understand (cognition). Work and work Statistician. Reproductive health. Screening  You may have the following tests or measurements: Height, weight, and BMI. Blood pressure. Lipid and cholesterol levels. These may be checked every 5 years, or more frequently if you are over 18 years old. Skin check. Lung cancer screening. You may have this screening every year starting at age 16 if you have a 30-pack-year history of smoking and currently smoke or have quit within the past 15 years. Fecal occult blood test (FOBT) of the stool. You may have this test every year starting at age 29. Flexible sigmoidoscopy or colonoscopy. You may have a sigmoidoscopy every 5 years or a colonoscopy every 10 years starting at age 73. Hepatitis C blood test. Hepatitis B blood test. Sexually transmitted disease (STD) testing. Diabetes screening. This is done by checking your blood sugar (glucose) after you have not eaten for a while (fasting). You may have this done every 1-3 years. Bone density scan. This is done to screen for osteoporosis. You may have this done starting at age 101. Mammogram. This may be done every 1-2 years. Talk to your health care provider about how often you  should have regular mammograms. Talk with your health care provider about your test results, treatment options, and if necessary, the need for more tests. Vaccines  Your health care provider may recommend certain vaccines, such as: Influenza vaccine. This is recommended every year. Tetanus, diphtheria, and acellular pertussis (Tdap, Td) vaccine. You may need a Td booster every 10  years. Zoster vaccine. You may need this after age 30. Pneumococcal 13-valent conjugate (PCV13) vaccine. One dose is recommended after age 83. Pneumococcal polysaccharide (PPSV23) vaccine. One dose is recommended after age 20. Talk to your health care provider about which screenings and vaccines you need and how often you need them. This information is not intended to replace advice given to you by your health care provider. Make sure you discuss any questions you have with your health care provider. Document Released: 10/14/2015 Document Revised: 06/06/2016 Document Reviewed: 07/19/2015 Elsevier Interactive Patient Education  2017 [place] Prevention in the Home Falls can cause injuries. They can happen to people of all ages. There are many things you can do to make your home safe and to help prevent falls. What can I do on the outside of my home? Regularly fix the edges of walkways and driveways and fix any cracks. Remove anything that might make you trip as you walk through a door, such as a raised step or threshold. Trim any bushes or trees on the path to your home. Use bright outdoor lighting. Clear any walking paths of anything that might make someone trip, such as rocks or tools. Regularly check to see if handrails are loose or broken. Make sure that both sides of any steps have handrails. Any raised decks and porches should have guardrails on the edges. Have any leaves, snow, or ice cleared regularly. Use sand or salt on walking paths during winter. Clean up any spills in your garage right away. This includes oil or grease spills. What can I do in the bathroom? Use night lights. Install grab bars by the toilet and in the tub and shower. Do not use towel bars as grab bars. Use non-skid mats or decals in the tub or shower. If you need to sit down in the shower, use a plastic, non-slip stool. Keep the floor dry. Clean up any water that spills on the floor as soon as it  happens. Remove soap buildup in the tub or shower regularly. Attach bath mats securely with double-sided non-slip rug tape. Do not have throw rugs and other things on the floor that can make you trip. What can I do in the bedroom? Use night lights. Make sure that you have a light by your bed that is easy to reach. Do not use any sheets or blankets that are too big for your bed. They should not hang down onto the floor. Have a firm chair that has side arms. You can use this for support while you get dressed. Do not have throw rugs and other things on the floor that can make you trip. What can I do in the kitchen? Clean up any spills right away. Avoid walking on wet floors. Keep items that you use a lot in easy-to-reach places. If you need to reach something above you, use a strong step stool that has a grab bar. Keep electrical cords out of the way. Do not use floor polish or wax that makes floors slippery. If you must use wax, use non-skid floor wax. Do not have throw rugs and other things on the  floor that can make you trip. What can I do with my stairs? Do not leave any items on the stairs. Make sure that there are handrails on both sides of the stairs and use them. Fix handrails that are broken or loose. Make sure that handrails are as long as the stairways. Check any carpeting to make sure that it is firmly attached to the stairs. Fix any carpet that is loose or worn. Avoid having throw rugs at the top or bottom of the stairs. If you do have throw rugs, attach them to the floor with carpet tape. Make sure that you have a light switch at the top of the stairs and the bottom of the stairs. If you do not have them, ask someone to add them for you. What else can I do to help prevent falls? Wear shoes that: Do not have high heels. Have rubber bottoms. Are comfortable and fit you well. Are closed at the toe. Do not wear sandals. If you use a stepladder: Make sure that it is fully opened.  Do not climb a closed stepladder. Make sure that both sides of the stepladder are locked into place. Ask someone to hold it for you, if possible. Clearly mark and make sure that you can see: Any grab bars or handrails. First and last steps. Where the edge of each step is. Use tools that help you move around (mobility aids) if they are needed. These include: Canes. Walkers. Scooters. Crutches. Turn on the lights when you go into a dark area. Replace any light bulbs as soon as they burn out. Set up your furniture so you have a clear path. Avoid moving your furniture around. If any of your floors are uneven, fix them. If there are any pets around you, be aware of where they are. Review your medicines with your doctor. Some medicines can make you feel dizzy. This can increase your chance of falling. Ask your doctor what other things that you can do to help prevent falls. This information is not intended to replace advice given to you by your health care provider. Make sure you discuss any questions you have with your health care provider. Document Released: 07/14/2009 Document Revised: 02/23/2016 Document Reviewed: 10/22/2014 Elsevier Interactive Patient Education  2017 [employer].

## 2022-11-01 ENCOUNTER — Ambulatory Visit (INDEPENDENT_AMBULATORY_CARE_PROVIDER_SITE_OTHER): Payer: PPO | Admitting: Family Medicine

## 2022-11-01 ENCOUNTER — Encounter: Payer: Self-pay | Admitting: Family Medicine

## 2022-11-01 VITALS — BP 130/80 | HR 92 | Temp 99.1°F | Ht 61.25 in | Wt 127.8 lb

## 2022-11-01 DIAGNOSIS — M25511 Pain in right shoulder: Secondary | ICD-10-CM | POA: Diagnosis not present

## 2022-11-01 DIAGNOSIS — I10 Essential (primary) hypertension: Secondary | ICD-10-CM

## 2022-11-01 DIAGNOSIS — Z96653 Presence of artificial knee joint, bilateral: Secondary | ICD-10-CM

## 2022-11-01 DIAGNOSIS — Z0000 Encounter for general adult medical examination without abnormal findings: Secondary | ICD-10-CM

## 2022-11-01 DIAGNOSIS — F901 Attention-deficit hyperactivity disorder, predominantly hyperactive type: Secondary | ICD-10-CM | POA: Diagnosis not present

## 2022-11-01 DIAGNOSIS — G479 Sleep disorder, unspecified: Secondary | ICD-10-CM | POA: Diagnosis not present

## 2022-11-01 DIAGNOSIS — D126 Benign neoplasm of colon, unspecified: Secondary | ICD-10-CM

## 2022-11-01 DIAGNOSIS — B001 Herpesviral vesicular dermatitis: Secondary | ICD-10-CM

## 2022-11-01 DIAGNOSIS — Z1231 Encounter for screening mammogram for malignant neoplasm of breast: Secondary | ICD-10-CM

## 2022-11-01 DIAGNOSIS — Z79899 Other long term (current) drug therapy: Secondary | ICD-10-CM | POA: Diagnosis not present

## 2022-11-01 DIAGNOSIS — Z800 Family history of malignant neoplasm of digestive organs: Secondary | ICD-10-CM | POA: Diagnosis not present

## 2022-11-01 DIAGNOSIS — E782 Mixed hyperlipidemia: Secondary | ICD-10-CM | POA: Diagnosis not present

## 2022-11-01 DIAGNOSIS — I341 Nonrheumatic mitral (valve) prolapse: Secondary | ICD-10-CM

## 2022-11-01 DIAGNOSIS — I7300 Raynaud's syndrome without gangrene: Secondary | ICD-10-CM | POA: Diagnosis not present

## 2022-11-01 DIAGNOSIS — M7581 Other shoulder lesions, right shoulder: Secondary | ICD-10-CM

## 2022-11-01 DIAGNOSIS — Z8249 Family history of ischemic heart disease and other diseases of the circulatory system: Secondary | ICD-10-CM | POA: Diagnosis not present

## 2022-11-01 DIAGNOSIS — E785 Hyperlipidemia, unspecified: Secondary | ICD-10-CM | POA: Diagnosis not present

## 2022-11-01 LAB — COMPREHENSIVE METABOLIC PANEL
ALT: 16 IU/L (ref 0–32)
AST: 17 IU/L (ref 0–40)
Albumin/Globulin Ratio: 2.4 — ABNORMAL HIGH (ref 1.2–2.2)
Albumin: 4.7 g/dL (ref 3.9–4.9)
Alkaline Phosphatase: 109 IU/L (ref 44–121)
BUN/Creatinine Ratio: 9 — ABNORMAL LOW (ref 12–28)
BUN: 8 mg/dL (ref 8–27)
Bilirubin Total: 1.2 mg/dL (ref 0.0–1.2)
CO2: 24 mmol/L (ref 20–29)
Calcium: 9.6 mg/dL (ref 8.7–10.3)
Chloride: 97 mmol/L (ref 96–106)
Creatinine, Ser: 0.85 mg/dL (ref 0.57–1.00)
Globulin, Total: 2 g/dL (ref 1.5–4.5)
Glucose: 105 mg/dL — ABNORMAL HIGH (ref 70–99)
Potassium: 4.3 mmol/L (ref 3.5–5.2)
Sodium: 138 mmol/L (ref 134–144)
Total Protein: 6.7 g/dL (ref 6.0–8.5)
eGFR: 76 mL/min/{1.73_m2} (ref >59)

## 2022-11-01 LAB — LIPID PANEL
Chol/HDL Ratio: 2.8 ratio (ref 0.0–4.4)
Cholesterol, Total: 289 mg/dL — ABNORMAL HIGH (ref 100–199)
HDL: 102 mg/dL (ref >39)
LDL Chol Calc (NIH): 167 mg/dL — ABNORMAL HIGH (ref 0–99)
Triglycerides: 117 mg/dL (ref 0–149)
VLDL Cholesterol Cal: 20 mg/dL (ref 5–40)

## 2022-11-01 LAB — CBC WITH DIFFERENTIAL/PLATELET
Basophils Absolute: 0 10*3/uL (ref 0.0–0.2)
Basos: 0 %
EOS (ABSOLUTE): 0 10*3/uL (ref 0.0–0.4)
Eos: 1 %
Hematocrit: 41.8 % (ref 34.0–46.6)
Hemoglobin: 14.4 g/dL (ref 11.1–15.9)
Immature Grans (Abs): 0 10*3/uL (ref 0.0–0.1)
Immature Granulocytes: 0 %
Lymphocytes Absolute: 1.5 10*3/uL (ref 0.7–3.1)
Lymphs: 26 %
MCH: 35.6 pg — ABNORMAL HIGH (ref 26.6–33.0)
MCHC: 34.4 g/dL (ref 31.5–35.7)
MCV: 103 fL — ABNORMAL HIGH (ref 79–97)
Monocytes Absolute: 0.6 10*3/uL (ref 0.1–0.9)
Monocytes: 10 %
Neutrophils Absolute: 3.6 10*3/uL (ref 1.4–7.0)
Neutrophils: 63 %
Platelets: 215 10*3/uL (ref 150–450)
RBC: 4.05 x10E6/uL (ref 3.77–5.28)
RDW: 11.3 % — ABNORMAL LOW (ref 11.7–15.4)
WBC: 5.7 10*3/uL (ref 3.4–10.8)

## 2022-11-01 MED ORDER — HYDROCHLOROTHIAZIDE 12.5 MG PO CAPS: ORAL_CAPSULE | ORAL | 3 refills | Status: DC

## 2022-11-01 MED ORDER — LIDOCAINE HCL (PF) 2 % IJ SOLN
3.0000 mL | Freq: Once | INTRAMUSCULAR | Status: AC
  Administered 2022-11-01: 3 mL

## 2022-11-01 MED ORDER — ROSUVASTATIN CALCIUM 5 MG PO TABS: 5.0000 mg | ORAL_TABLET | Freq: Every day | ORAL | 3 refills | Status: DC

## 2022-11-01 MED ORDER — TRAZODONE HCL 50 MG PO TABS: 50.0000 mg | ORAL_TABLET | Freq: Every day | ORAL | 3 refills | Status: DC

## 2022-11-01 MED ORDER — DILTIAZEM HCL ER COATED BEADS 120 MG PO CP24: 120.0000 mg | ORAL_CAPSULE | Freq: Every day | ORAL | 3 refills | Status: DC

## 2022-11-01 MED ORDER — TRIAMCINOLONE ACETONIDE 40 MG/ML IJ SUSP
40.0000 mg | Freq: Once | INTRAMUSCULAR | Status: AC
  Administered 2022-11-01: 40 mg via INTRAMUSCULAR

## 2022-11-01 NOTE — Progress Notes (Addendum)
Complete physical exam  Patient: Annette Murray   DOB: June 16, 1956   67 y.o. Female  MRN: [id number]  Subjective:    Chief Complaint  Patient presents with   Annual Exam    Had AWV with [facility] on 10/30/2022. Fasting. No additional concerns.     Annette Murray is a 67 y.o. female who presents today for a complete physical exam. She reports consuming a general diet. The patient does not participate in regular exercise at present. She generally feels well. She reports sleeping poorly (trouble staying asleep).  She uses trazodone for this and has been using this for several years.  She also has a 2-week history of right shoulder discomfort and notes that she has had difficulty intermittently with this in the past.  Recently this was due to doing a lot of painting.  She continues on HCTZ, atenolol and diltiazem.  She has a history of MVP.  She has had a coronary calcium score of 0.  There is a positive family history for heart disease.  She does have an underlying history of Raynaud's disease.  She continues on Adderall on an as-needed basis for her underlying ADHD which does help her focus.  She does have a history of tubular adenoma and is scheduled for follow-up colonoscopy per protocol.  She has not had any difficulty recently with herpes labialis.  She has had previous TKR and is doing fine with that.  Home life is going well. Most recent fall risk assessment:    10/30/2022    2:43 PM  Simpson in the past year? 0  Number falls in past yr: 0  Injury with Fall? 0  Risk for fall due to : Medication side effect  Follow up Falls prevention discussed;Education provided;Falls evaluation completed     Most recent depression screenings:    10/30/2022    2:43 PM 10/27/2021    9:37 AM  PHQ 2/9 Scores  PHQ - 2 Score 0 0  PHQ- 9 Score 3     Vision:Within last year and Dental: Receives regular dental care    Patient Care Team: Denita Lung, MD as PCP - General (Family  Medicine) Dorothy Spark, MD as PCP - Cardiology (Cardiology)   Outpatient Medications Prior to Visit  Medication Sig   amphetamine-dextroamphetamine (ADDERALL XR) 20 MG 24 hr capsule Take 1 capsule (20 mg total) by mouth daily.   aspirin EC 81 MG tablet Take 1 tablet (81 mg total) by mouth daily. Swallow whole.   diclofenac Sodium (VOLTAREN) 1 % GEL Apply topically.   fluticasone (FLONASE) 50 MCG/ACT nasal spray Place 1 spray into both nostrils at bedtime as needed for allergies or rhinitis.   hydroquinone 4 % cream APPLY TO AFFECTED AREA TWICE A DAY   Multiple Vitamin (MULTIVITAMIN WITH MINERALS) TABS tablet Take 1 tablet by mouth daily.   Nutritional Supplements (VITAMIN D BOOSTER PO) Vitamin D   valACYclovir (VALTREX) 500 MG tablet TAKE 1 TABLET BY MOUTH EVERY MORNING.   vitamin k 100 MCG tablet    [DISCONTINUED] diltiazem (CARDIZEM CD) 120 MG 24 hr capsule Take 1 capsule (120 mg total) by mouth daily.   [DISCONTINUED] hydrochlorothiazide (MICROZIDE) 12.5 MG capsule TAKE 1 CAPSULE BY MOUTH EVERY DAY   [DISCONTINUED] traZODone (DESYREL) 50 MG tablet Take 1 tablet (50 mg total) by mouth at bedtime.   amoxicillin-clavulanate (AUGMENTIN) 875-125 MG tablet Take 1 tablet by mouth 2 (two) times daily. (Patient not taking: Reported  on 10/27/2021)   Multiple Vitamins-Minerals (ZINC PO) zinc (Patient not taking: Reported on 11/01/2022)   PREMARIN vaginal cream INSERT 0.5 APPLICATOR(S)FUL TWICE A WEEK BY VAGINAL ROUTE. (Patient not taking: Reported on 10/27/2021)   [DISCONTINUED] rosuvastatin (CRESTOR) 5 MG tablet Take 1 tablet (5 mg total) by mouth daily. (Patient not taking: Reported on 10/27/2021)   No facility-administered medications prior to visit.    Review of Systems  All other systems reviewed and are negative.         Objective:     BP 130/80   Pulse 92   Temp 99.1 F (37.3 C) (Oral)   Ht 5' 1.25" (1.556 m)   Wt 127 lb 12.8 oz (58 kg)   SpO2 95% Comment: room air  BMI  23.95 kg/m    Physical Exam  Alert and in no distress. Tympanic membranes and canals are normal. Pharyngeal area is normal. Neck is supple without adenopathy or thyromegaly. Cardiac exam shows a regular sinus rhythm without murmurs or gallops. Lungs are clear to auscultation. Right shoulder exam does show pain with drop arm test.  Supraspinatus testing was also uncomfortable.  Neer's and Hawkins test caused pain and slight clicking sensation was noted.  Negative sulcus sign.     Assessment & Plan:    Routine general medical examination at a health care facility - Plan: CBC with Differential/Platelet, Comprehensive metabolic panel, Lipid panel  Essential hypertension - Plan: CBC with Differential/Platelet, Comprehensive metabolic panel, hydrochlorothiazide (MICROZIDE) 12.5 MG capsule, diltiazem (CARDIZEM CD) 120 MG 24 hr capsule  MVP (mitral valve prolapse)  Raynaud's disease without gangrene - Plan: diltiazem (CARDIZEM CD) 120 MG 24 hr capsule  Tubular adenoma of colon  Herpes labialis  ADHD (attention deficit hyperactivity disorder), predominantly hyperactive impulsive type  Hyperlipidemia LDL goal <100 - Plan: rosuvastatin (CRESTOR) 5 MG tablet  Status post total bilateral knee replacement  Sleep disturbance - Plan: traZODone (DESYREL) 50 MG tablet  Elevated triglycerides with high cholesterol - Plan: rosuvastatin (CRESTOR) 5 MG tablet  Medication management - Plan: CBC with Differential/Platelet, Comprehensive metabolic panel, Lipid panel  Tendinitis of right rotator cuff - Plan: Ambulatory referral to Physical Therapy  Family history of heart disease  Family history of colon cancer The right shoulder was prepped with Betadine.  40 mg of Kenalog and 3 cc of Xylocaine was injected into the right subacromial bursa without difficulty.  She tolerated the procedure well.  All okay good Immunization History  Administered Date(s) Administered   Fluad Quad(high Dose 65+)  06/27/2021, 07/02/2022   Hepatitis B 07/01/1994   Influenza Split 07/10/2012, 07/10/2013, 08/18/2014, 07/16/2015   Influenza Whole 05/30/2011   Influenza,inj,Quad PF,6+ Mos 06/16/2018, 07/07/2019   Influenza,inj,quad, With Preservative 07/01/2017   Influenza-Unspecified 08/03/2016, 07/18/2020   PFIZER Comirnaty(Gray Top)Covid-19 Tri-Sucrose Vaccine 03/22/2020, 04/12/2020, 10/09/2020, 04/14/2021   Pneumococcal Conjugate-13 10/01/2013   Pneumococcal Polysaccharide-23 10/27/2021   Pneumococcal-Unspecified 10/01/2013   Respiratory Syncytial Virus Vaccine,Recomb Aduvanted(Arexvy) 08/07/2022   Tdap 10/01/2009, 05/22/2010, 10/19/2020   Unspecified SARS-COV-2 Vaccination 10/02/2021, 07/02/2022   Zoster Recombinat (Shingrix) 05/20/2017, 11/13/2017   Zoster, Live 10/21/2015    Health Maintenance  Topic Date Due   MAMMOGRAM  05/09/2022   COVID-19 Vaccine (7 - 2023-24 season) 12/28/2022 (Originally 08/27/2022)   Medicare Annual Wellness (AWV)  10/31/2023   DTaP/Tdap/Td (4 - Td or Tdap) 10/19/2030   COLONOSCOPY (Pts 45-18yr Insurance coverage will need to be confirmed)  05/24/2032   Pneumonia Vaccine 66 Years old  Completed   INFLUENZA VACCINE  Completed  DEXA SCAN  Completed   Hepatitis C Screening  Completed   Zoster Vaccines- Shingrix  Completed   HPV VACCINES  Aged Out   Discussed the positive family history of heart disease and 0 coronary calcium score.  We will readdress this when I look at her lipids. Discussed the shoulder pain with her and she is in agreement to have an injection as well as referred to physical therapy.  She will continue on her present medication regimen.  Discussed gynecologic issues with her and I will order a mammogram.  She has had a DEXA scan which is apparently negative.  We will get a copy of that.  Problem List Items Addressed This Visit     ADHD (attention deficit hyperactivity disorder), predominantly hyperactive impulsive type   Essential hypertension    Relevant Medications   rosuvastatin (CRESTOR) 5 MG tablet   hydrochlorothiazide (MICROZIDE) 12.5 MG capsule   diltiazem (CARDIZEM CD) 120 MG 24 hr capsule   Other Relevant Orders   CBC with Differential/Platelet   Comprehensive metabolic panel   Herpes labialis   Hyperlipidemia LDL goal <100   Relevant Medications   rosuvastatin (CRESTOR) 5 MG tablet   hydrochlorothiazide (MICROZIDE) 12.5 MG capsule   diltiazem (CARDIZEM CD) 120 MG 24 hr capsule   MVP (mitral valve prolapse)   Relevant Medications   rosuvastatin (CRESTOR) 5 MG tablet   hydrochlorothiazide (MICROZIDE) 12.5 MG capsule   diltiazem (CARDIZEM CD) 120 MG 24 hr capsule   Raynaud's disease   Relevant Medications   rosuvastatin (CRESTOR) 5 MG tablet   hydrochlorothiazide (MICROZIDE) 12.5 MG capsule   diltiazem (CARDIZEM CD) 120 MG 24 hr capsule   S/P TKR (total knee replacement)   Sleep disturbance   Relevant Medications   traZODone (DESYREL) 50 MG tablet   Tubular adenoma of colon   Other Visit Diagnoses     Routine general medical examination at a health care facility    -  Primary   Relevant Orders   CBC with Differential/Platelet   Comprehensive metabolic panel   Lipid panel   Elevated triglycerides with high cholesterol       Relevant Medications   rosuvastatin (CRESTOR) 5 MG tablet   hydrochlorothiazide (MICROZIDE) 12.5 MG capsule   diltiazem (CARDIZEM CD) 120 MG 24 hr capsule   Medication management       Relevant Orders   CBC with Differential/Platelet   Comprehensive metabolic panel   Lipid panel   Tendinitis of right rotator cuff       Relevant Orders   Ambulatory referral to Physical Therapy      No follow-ups on file.     Jill Alexanders, MD

## 2022-11-01 NOTE — Addendum Note (Signed)
Addended by: Randal Buba on: 11/01/2022 10:33 AM   Modules accepted: Orders

## 2022-11-06 ENCOUNTER — Encounter: Payer: Self-pay | Admitting: Family Medicine

## 2022-11-07 NOTE — Telephone Encounter (Signed)
I called patient and gave phone number to [facility]. Patient already received a call for PT.

## 2022-11-11 DIAGNOSIS — M25512 Pain in left shoulder: Secondary | ICD-10-CM | POA: Diagnosis not present

## 2022-11-15 ENCOUNTER — Encounter: Payer: Self-pay | Admitting: Family Medicine

## 2022-11-19 ENCOUNTER — Ambulatory Visit: Payer: Self-pay | Admitting: Orthopedic Surgery

## 2022-11-19 ENCOUNTER — Ambulatory Visit: Payer: PPO | Admitting: Rehabilitative and Restorative Service Providers"

## 2022-11-20 DIAGNOSIS — M25512 Pain in left shoulder: Secondary | ICD-10-CM | POA: Diagnosis not present

## 2022-11-27 ENCOUNTER — Telehealth: Payer: Self-pay | Admitting: Family Medicine

## 2022-11-27 DIAGNOSIS — F901 Attention-deficit hyperactivity disorder, predominantly hyperactive type: Secondary | ICD-10-CM

## 2022-11-27 MED ORDER — AMPHETAMINE-DEXTROAMPHET ER 20 MG PO CP24: 20.0000 mg | ORAL_CAPSULE | Freq: Every day | ORAL | 0 refills | Status: DC

## 2022-11-27 NOTE — Telephone Encounter (Signed)
Pt left message needs refill Adderall XR to [facility]

## 2022-11-30 ENCOUNTER — Other Ambulatory Visit: Payer: Self-pay | Admitting: Family Medicine

## 2022-11-30 DIAGNOSIS — B001 Herpesviral vesicular dermatitis: Secondary | ICD-10-CM

## 2022-12-07 DIAGNOSIS — S42035A Nondisplaced fracture of lateral end of left clavicle, initial encounter for closed fracture: Secondary | ICD-10-CM | POA: Diagnosis not present

## 2022-12-07 DIAGNOSIS — S42009A Fracture of unspecified part of unspecified clavicle, initial encounter for closed fracture: Secondary | ICD-10-CM | POA: Insufficient documentation

## 2022-12-21 DIAGNOSIS — S42035D Nondisplaced fracture of lateral end of left clavicle, subsequent encounter for fracture with routine healing: Secondary | ICD-10-CM | POA: Diagnosis not present

## 2023-01-03 ENCOUNTER — Telehealth: Payer: Self-pay | Admitting: Family Medicine

## 2023-01-03 DIAGNOSIS — F901 Attention-deficit hyperactivity disorder, predominantly hyperactive type: Secondary | ICD-10-CM

## 2023-01-03 MED ORDER — AMPHETAMINE-DEXTROAMPHET ER 20 MG PO CP24: 20.0000 mg | ORAL_CAPSULE | Freq: Every day | ORAL | 0 refills | Status: DC

## 2023-01-03 NOTE — Telephone Encounter (Signed)
Pt left message [facility] [address]

## 2023-01-04 DIAGNOSIS — S42035D Nondisplaced fracture of lateral end of left clavicle, subsequent encounter for fracture with routine healing: Secondary | ICD-10-CM | POA: Diagnosis not present

## 2023-01-08 ENCOUNTER — Ambulatory Visit
Admission: RE | Admit: 2023-01-08 | Discharge: 2023-01-08 | Disposition: A | Discharge status: Home / Self Care | Payer: PPO | Source: Ambulatory Visit | Attending: Family Medicine | Admitting: Family Medicine

## 2023-01-08 DIAGNOSIS — M25612 Stiffness of left shoulder, not elsewhere classified: Secondary | ICD-10-CM | POA: Diagnosis not present

## 2023-01-08 DIAGNOSIS — M6281 Muscle weakness (generalized): Secondary | ICD-10-CM | POA: Diagnosis not present

## 2023-01-08 DIAGNOSIS — Z1231 Encounter for screening mammogram for malignant neoplasm of breast: Secondary | ICD-10-CM | POA: Diagnosis not present

## 2023-01-08 DIAGNOSIS — S42035D Nondisplaced fracture of lateral end of left clavicle, subsequent encounter for fracture with routine healing: Secondary | ICD-10-CM | POA: Diagnosis not present

## 2023-01-15 DIAGNOSIS — M25612 Stiffness of left shoulder, not elsewhere classified: Secondary | ICD-10-CM | POA: Diagnosis not present

## 2023-01-15 DIAGNOSIS — S42035D Nondisplaced fracture of lateral end of left clavicle, subsequent encounter for fracture with routine healing: Secondary | ICD-10-CM | POA: Diagnosis not present

## 2023-01-15 DIAGNOSIS — M6281 Muscle weakness (generalized): Secondary | ICD-10-CM | POA: Diagnosis not present

## 2023-01-22 DIAGNOSIS — M6281 Muscle weakness (generalized): Secondary | ICD-10-CM | POA: Diagnosis not present

## 2023-01-22 DIAGNOSIS — M25612 Stiffness of left shoulder, not elsewhere classified: Secondary | ICD-10-CM | POA: Diagnosis not present

## 2023-01-22 DIAGNOSIS — S42035D Nondisplaced fracture of lateral end of left clavicle, subsequent encounter for fracture with routine healing: Secondary | ICD-10-CM | POA: Diagnosis not present

## 2023-01-29 DIAGNOSIS — S42035A Nondisplaced fracture of lateral end of left clavicle, initial encounter for closed fracture: Secondary | ICD-10-CM | POA: Diagnosis not present

## 2023-02-11 ENCOUNTER — Telehealth: Payer: Self-pay | Admitting: Family Medicine

## 2023-02-11 DIAGNOSIS — F901 Attention-deficit hyperactivity disorder, predominantly hyperactive type: Secondary | ICD-10-CM

## 2023-02-11 MED ORDER — AMPHETAMINE-DEXTROAMPHET ER 20 MG PO CP24: 20.0000 mg | ORAL_CAPSULE | Freq: Every day | ORAL | 0 refills | Status: DC

## 2023-02-11 NOTE — Telephone Encounter (Signed)
Pt called and is requesting a refill on his adderall please send to the  [id number] - [place], [place] - [address] AT [address] OF [facility] [address]

## 2023-03-18 ENCOUNTER — Telehealth: Payer: Self-pay | Admitting: Family Medicine

## 2023-03-18 DIAGNOSIS — F901 Attention-deficit hyperactivity disorder, predominantly hyperactive type: Secondary | ICD-10-CM

## 2023-03-18 NOTE — Telephone Encounter (Signed)
Pt needs refill [facility] XR 20mg  [facility]

## 2023-03-19 MED ORDER — AMPHETAMINE-DEXTROAMPHET ER 20 MG PO CP24: 20.0000 mg | ORAL_CAPSULE | Freq: Every day | ORAL | 0 refills | Status: DC

## 2023-03-28 ENCOUNTER — Encounter: Payer: Self-pay | Admitting: Family Medicine

## 2023-03-28 ENCOUNTER — Ambulatory Visit (INDEPENDENT_AMBULATORY_CARE_PROVIDER_SITE_OTHER): Payer: PPO | Admitting: Family Medicine

## 2023-03-28 VITALS — BP 122/74 | HR 94 | Temp 98.2°F | Resp 16 | Wt 130.6 lb

## 2023-03-28 DIAGNOSIS — M7989 Other specified soft tissue disorders: Secondary | ICD-10-CM | POA: Diagnosis not present

## 2023-03-28 DIAGNOSIS — I7300 Raynaud's syndrome without gangrene: Secondary | ICD-10-CM | POA: Diagnosis not present

## 2023-03-28 NOTE — Progress Notes (Signed)
   Subjective:    Patient ID: Annette Murray, female    DOB: [birth year], 67 y.o.   MRN: [id number]  HPI She is here for evaluation of swelling, discoloration distal to her ring on the right ring finger.  She does have a history of Raynaud's.   Review of Systems     Objective:    Physical Exam Exam of the right ring finger shows the ring to be moving freely however she does have distal swelling with purpura.  It is slightly cool to touch.       Assessment & Plan:   Problem List Items Addressed This Visit     Raynaud's disease - Primary   Other Visit Diagnoses     Finger swelling         The ring could not be removed manually so I used quarter inch packing strip and started distally to put compression to the whole finger to reduce the swelling.  I then placed the edge of it underneath the ring and slowly was able to pull the ring off.  The area remained slightly purpuric.  I explained that I thought this was probably a disruption of the venous flow and hopefully removing the ring will allow [place] to return to normal.  If she has difficulty she will call.

## 2023-04-18 ENCOUNTER — Telehealth: Payer: Self-pay | Admitting: Family Medicine

## 2023-04-18 DIAGNOSIS — F901 Attention-deficit hyperactivity disorder, predominantly hyperactive type: Secondary | ICD-10-CM

## 2023-04-18 MED ORDER — AMPHETAMINE-DEXTROAMPHET ER 20 MG PO CP24: 20.0000 mg | ORAL_CAPSULE | Freq: Every day | ORAL | 0 refills | Status: DC

## 2023-04-18 NOTE — Telephone Encounter (Signed)
Prescription Request  04/18/2023  LOV: 03/28/2023  What is the name of the medication or equipment? Adderall  Have you contacted your pharmacy to request a refill? Yes   Which pharmacy would you like this sent to?   [id number] - [place], [place] - [address] AT [address] OF [facility] [address]   Patient notified that their request is being sent to the clinical staff for review and that they should receive a response within 2 business days.   Please advise at Mobile [phone] (mobile)

## 2023-04-19 ENCOUNTER — Encounter: Payer: Self-pay | Admitting: Family Medicine

## 2023-04-19 DIAGNOSIS — L814 Other melanin hyperpigmentation: Secondary | ICD-10-CM

## 2023-04-19 MED ORDER — HYDROQUINONE 4 % EX CREA: TOPICAL_CREAM | CUTANEOUS | 6 refills | Status: AC

## 2023-05-30 ENCOUNTER — Other Ambulatory Visit: Payer: Self-pay | Admitting: Family Medicine

## 2023-05-30 DIAGNOSIS — B001 Herpesviral vesicular dermatitis: Secondary | ICD-10-CM

## 2023-05-30 NOTE — Telephone Encounter (Signed)
Is this okay to refill? 

## 2023-06-01 DIAGNOSIS — M79672 Pain in left foot: Secondary | ICD-10-CM | POA: Diagnosis not present

## 2023-06-01 DIAGNOSIS — M76822 Posterior tibial tendinitis, left leg: Secondary | ICD-10-CM | POA: Diagnosis not present

## 2023-06-01 DIAGNOSIS — M2142 Flat foot [pes planus] (acquired), left foot: Secondary | ICD-10-CM | POA: Diagnosis not present

## 2023-06-04 ENCOUNTER — Telehealth: Payer: Self-pay | Admitting: Family Medicine

## 2023-06-04 DIAGNOSIS — F901 Attention-deficit hyperactivity disorder, predominantly hyperactive type: Secondary | ICD-10-CM

## 2023-06-04 MED ORDER — AMPHETAMINE-DEXTROAMPHET ER 20 MG PO CP24: 20.0000 mg | ORAL_CAPSULE | Freq: Every day | ORAL | 0 refills | Status: DC

## 2023-06-04 NOTE — Telephone Encounter (Signed)
Pt left message needs refill [facility] XR [facility] [address]  

## 2023-06-05 ENCOUNTER — Telehealth: Payer: Self-pay

## 2023-06-05 NOTE — Telephone Encounter (Signed)
Regarding amphetamine salts ER 20 MG Cap. Quantity: 30. Rx is backordered/ unavailable at pharmacy.

## 2023-06-06 NOTE — Telephone Encounter (Signed)
Spoke to pt and she is aware. She is going to either wait a little bit or locate at another pharmacy She will let us know if she needs rx sent to different pharmacy

## 2023-06-12 DIAGNOSIS — M76822 Posterior tibial tendinitis, left leg: Secondary | ICD-10-CM | POA: Diagnosis not present

## 2023-06-12 DIAGNOSIS — M2142 Flat foot [pes planus] (acquired), left foot: Secondary | ICD-10-CM | POA: Diagnosis not present

## 2023-06-18 ENCOUNTER — Telehealth: Payer: Self-pay | Admitting: Family Medicine

## 2023-06-18 DIAGNOSIS — F901 Attention-deficit hyperactivity disorder, predominantly hyperactive type: Secondary | ICD-10-CM

## 2023-06-18 MED ORDER — AMPHETAMINE-DEXTROAMPHET ER 20 MG PO CP24: 20.0000 mg | ORAL_CAPSULE | Freq: Every day | ORAL | 0 refills | Status: DC

## 2023-06-18 NOTE — Telephone Encounter (Signed)
Pt left message, she needs adderall XR 20 sent to pharmacy again, they now have in stock but told her that since we did not do an alternative when it was out, they need new rx

## 2023-07-03 DIAGNOSIS — M25572 Pain in left ankle and joints of left foot: Secondary | ICD-10-CM | POA: Diagnosis not present

## 2023-07-25 ENCOUNTER — Telehealth: Payer: Self-pay | Admitting: Family Medicine

## 2023-07-25 DIAGNOSIS — F901 Attention-deficit hyperactivity disorder, predominantly hyperactive type: Secondary | ICD-10-CM

## 2023-07-25 NOTE — Telephone Encounter (Signed)
Pt left message request refill Ad[facility]all XR [facility]mg   [facility]

## 2023-07-26 MED ORDER — AMPHETAMINE-DEXTROAMPHET ER 20 MG PO CP24: 20.0000 mg | ORAL_CAPSULE | ORAL | 0 refills | Status: DC

## 2023-07-26 MED ORDER — AMPHETAMINE-DEXTROAMPHET ER 20 MG PO CP24: 20.0000 mg | ORAL_CAPSULE | Freq: Every day | ORAL | 0 refills | Status: DC

## 2023-09-03 ENCOUNTER — Telehealth: Payer: Self-pay | Admitting: Family Medicine

## 2023-09-03 NOTE — Telephone Encounter (Signed)
Needs adderall xr 20 refill  [facility] [address]

## 2023-10-04 DIAGNOSIS — M25572 Pain in left ankle and joints of left foot: Secondary | ICD-10-CM | POA: Diagnosis not present

## 2023-10-04 DIAGNOSIS — M76822 Posterior tibial tendinitis, left leg: Secondary | ICD-10-CM | POA: Diagnosis not present

## 2023-10-04 DIAGNOSIS — M2142 Flat foot [pes planus] (acquired), left foot: Secondary | ICD-10-CM | POA: Diagnosis not present

## 2023-10-08 DIAGNOSIS — L820 Inflamed seborrheic keratosis: Secondary | ICD-10-CM | POA: Diagnosis not present

## 2023-10-21 DIAGNOSIS — M25572 Pain in left ankle and joints of left foot: Secondary | ICD-10-CM | POA: Diagnosis not present

## 2023-10-22 DIAGNOSIS — D3131 Benign neoplasm of right choroid: Secondary | ICD-10-CM | POA: Diagnosis not present

## 2023-10-28 DIAGNOSIS — M2142 Flat foot [pes planus] (acquired), left foot: Secondary | ICD-10-CM | POA: Diagnosis not present

## 2023-10-28 DIAGNOSIS — M76822 Posterior tibial tendinitis, left leg: Secondary | ICD-10-CM | POA: Diagnosis not present

## 2023-11-05 ENCOUNTER — Ambulatory Visit: Payer: PPO

## 2023-11-05 DIAGNOSIS — Z0000 Encounter for general adult medical examination without abnormal findings: Secondary | ICD-10-CM

## 2023-11-05 NOTE — Progress Notes (Signed)
 Subjective:   Annette Murray is a 68 y.o. female who presents for Medicare Annual (Subsequent) preventive examination.  Visit Complete: Virtual I connected with  Kashlyn M Vetter on 11/05/23 by a audio enabled telemedicine application and verified that I am speaking with the correct person using two identifiers.  Interactive audio and video telecommunications were attempted between this provider and patient, however failed, due to patient having technical difficulties OR patient did not have access to video capability.  We continued and completed visit with audio only.  Patient Location: Home  Provider Location: Office/Clinic  I discussed the limitations of evaluation and management by telemedicine. The patient expressed understanding and agreed to proceed.  Vital Signs: Because this visit was a virtual/telehealth visit, some criteria may be missing or patient reported. Any vitals not documented were not able to be obtained and vitals that have been documented are patient reported.  Patient Medicare AWV questionnaire was completed by the patient on 11/04/2023; I have confirmed that all information answered by patient is correct and no changes since this date.  Cardiac Risk Factors include: advanced age (>72men, >61 women);dyslipidemia;hypertension     Objective:    Today's Vitals   There is no height or weight on file to calculate BMI.     11/05/2023    2:51 PM 10/30/2022    2:42 PM 10/27/2021    9:36 AM 07/10/2018    5:00 PM 07/10/2018   10:25 AM 07/02/2018    4:16 PM 04/28/2012    2:30 PM  Advanced Directives  Does Patient Have a Medical Advance Directive? Yes Yes Yes Yes Yes Yes Patient has advance directive, copy in chart  Type of Advance Directive Healthcare Power of [place];Living will Healthcare Power of [place];Living will Living will Healthcare Power of [place];Living will Healthcare Power of [place];Living will Healthcare Power of [place];Living will Healthcare Power  of Attorney  Does patient want to make changes to medical advance directive?   No - Patient declined No - Patient declined No - Patient declined No - Patient declined   Copy of Healthcare Power of Attorney in Chart? Yes - validated most recent copy scanned in chart (See row information) Yes - validated most recent copy scanned in chart (See row information)  Yes Yes Yes   Pre-existing out of facility DNR order (yellow form or pink MOST form)       No    Current Medications (verified) Outpatient Encounter Medications as of 11/05/2023  Medication Sig   amphetamine -dextroamphetamine  (ADDERALL XR) 20 MG 24 hr capsule Take 1 capsule (20 mg total) by mouth daily.   diclofenac Sodium (VOLTAREN) 1 % GEL Apply topically.   diltiazem  (CARDIZEM  CD) 120 MG 24 hr capsule Take 1 capsule (120 mg total) by mouth daily.   fluticasone  (FLONASE ) 50 MCG/ACT nasal spray Place 1 spray into both nostrils at bedtime as needed for allergies or rhinitis.   hydrochlorothiazide  (MICROZIDE ) 12.5 MG capsule TAKE 1 CAPSULE BY MOUTH EVERY DAY   hydroquinone  4 % cream APPLY TO AFFECTED AREA TWICE A DAY   Multiple Vitamin (MULTIVITAMIN WITH MINERALS) TABS tablet Take 1 tablet by mouth daily.   Nutritional Supplements (VITAMIN D BOOSTER PO) Vitamin D   rosuvastatin  (CRESTOR ) 5 MG tablet Take 1 tablet (5 mg total) by mouth daily.   traZODone  (DESYREL ) 50 MG tablet Take 1 tablet (50 mg total) by mouth at bedtime.   valACYclovir  (VALTREX ) 500 MG tablet TAKE 1 TABLET BY MOUTH EVERY DAY IN THE MORNING  amphetamine -dextroamphetamine  (ADDERALL XR) 20 MG 24 hr capsule Take 1 capsule (20 mg total) by mouth every morning.   amphetamine -dextroamphetamine  (ADDERALL XR) 20 MG 24 hr capsule Take 1 capsule (20 mg total) by mouth daily.   aspirin  EC 81 MG tablet Take 1 tablet (81 mg total) by mouth daily. Swallow whole.   Multiple Vitamins-Minerals (ZINC PO)  (Patient not taking: Reported on 11/05/2023)   PREMARIN vaginal cream  (Patient not  taking: Reported on 11/05/2023)   vitamin k 100 MCG tablet    No facility-administered encounter medications on file as of 11/05/2023.    Allergies (verified) Codeine   History: Past Medical History:  Diagnosis Date   ADD (attention deficit disorder)    Allergy    RHINITIS   Arthritis    Complication of anesthesia    itching    FH: colonic polyps    Heart murmur    MVP   Herpes labialis    Hypertension    borderline   MVP (mitral valve prolapse)    Pneumonia    hx of    Vitiligo    Past Surgical History:  Procedure Laterality Date   KNEE ARTHROPLASTY Left 07/10/2018   Procedure: LEFT TOTAL KNEE ARTHROPLASTY WITH COMPUTER NAVIGATION;  Surgeon: Fidel Rogue, MD;  Location: [facility];  Service: Orthopedics;  Laterality: Left;  block   KNEE ARTHROSCOPY     right x 2   right knee re built 15 years ago   TONSILLECTOMY     TOTAL KNEE ARTHROPLASTY  04/28/2012   Procedure: TOTAL KNEE ARTHROPLASTY;  Surgeon: Dempsey LULLA Moan, MD;  Location: [facility];  Service: Orthopedics;  Laterality: Right;   TUBAL LIGATION     Family History  Problem Relation Age of Onset   Arthritis Mother    Asthma Mother    Breast cancer Maternal Aunt    Social History   Socioeconomic History   Marital status: Married    Spouse name: Not on file   Number of children: Not on file   Years of education: Not on file   Highest education level: Not on file  Occupational History   Not on file  Tobacco Use   Smoking status: Never   Smokeless tobacco: Never  Vaping Use   Vaping status: Never Used  Substance and Sexual Activity   Alcohol  use: Yes    Alcohol /week: 2.0 standard drinks of alcohol     Types: 1 Cans of beer, 1 Shots of liquor per week    Comment: 2- drimks daily   Drug use: No   Sexual activity: Yes  Other Topics Concern   Not on file  Social History Narrative   Not on file   Social Drivers of Health   Financial Resource Strain: Low Risk  (11/05/2023)   Overall Financial Resource Strain  (CARDIA)    Difficulty of Paying Living Expenses: Not hard at all  Food Insecurity: No Food Insecurity (11/05/2023)   Hunger Vital Sign    Worried About Running Out of Food in the Last Year: Never true    Ran Out of Food in the Last Year: Never true  Transportation Needs: No Transportation Needs (11/05/2023)   PRAPARE - Administrator, Civil Service (Medical): No    Lack of Transportation (Non-Medical): No  Physical Activity: Insufficiently Active (11/05/2023)   Exercise Vital Sign    Days of Exercise per Week: 2 days    Minutes of Exercise per Session: 30 min  Stress: No Stress Concern  Present (11/05/2023)   [employer] of Occupational Health - Occupational Stress Questionnaire    Feeling of Stress : Not at all  Social Connections: Socially Integrated (11/05/2023)   Social Connection and Isolation Panel [NHANES]    Frequency of Communication with Friends and Family: More than three times a week    Frequency of Social Gatherings with Friends and Family: More than three times a week    Attends Religious Services: More than 4 times per year    Active Member of [employer] or Organizations: Yes    Attends Engineer, Structural: More than 4 times per year    Marital Status: Married    Tobacco Counseling Counseling given: Not Answered   Clinical Intake:  Pre-visit preparation completed: Yes  Pain : No/denies pain     Nutritional Risks: None Diabetes: No  How often do you need to have someone help you when you read instructions, pamphlets, or other written materials from your doctor or pharmacy?: 1 - Never  Interpreter Needed?: No  Information entered by :: NAllen LPN   Activities of Daily Living    11/04/2023    7:40 PM  In your present state of health, do you have any difficulty performing the following activities:  Hearing? 0  Vision? 0  Difficulty concentrating or making decisions? 0  Walking or climbing stairs? 0  Dressing or bathing? 0  Doing errands,  shopping? 0  Preparing Food and eating ? N  Using the Toilet? N  In the past six months, have you accidently leaked urine? N  Do you have problems with loss of bowel control? N  Managing your Medications? N  Managing your Finances? N  Housekeeping or managing your Housekeeping? N    Patient Care Team: Joyce Norleen BROCKS, MD as PCP - General (Family Medicine) Maranda Leim DEL, MD as PCP - Cardiology (Cardiology)  Indicate any recent Medical Services you may have received from other than [facility] providers in the past year (date may be approximate).     Assessment:   This is a routine wellness examination for Riannah.  Hearing/Vision screen Hearing Screening - Comments:: Denies hearing issues Vision Screening - Comments:: Regular eye exams, [facility], Dr. Marty Ill   Goals Addressed             This Visit's Progress    Patient Stated       11/05/2023, increase exercise       Depression Screen    11/05/2023    2:53 PM 10/30/2022    2:43 PM 10/27/2021    9:37 AM 10/19/2020   11:04 AM 07/07/2019   11:56 AM 05/20/2017    3:02 PM 08/26/2013    1:54 PM  PHQ 2/9 Scores  PHQ - 2 Score 0 0 0 0 0 0 0  PHQ- 9 Score 2 3         Fall Risk    11/04/2023    7:40 PM 10/30/2022    2:43 PM 10/27/2021    9:36 AM 07/07/2019   11:56 AM 05/20/2017    3:02 PM  Fall Risk   Falls in the past year? 1 0 1 0 No  Comment stepped in a hole      Number falls in past yr: 0 0 0 0   Injury with Fall? 0 0 0 0   Risk for fall due to : Medication side effect Medication side effect No Fall Risks    Follow up Falls prevention discussed;Falls evaluation completed  Falls prevention discussed;Education provided;Falls evaluation completed Falls evaluation completed      MEDICARE RISK AT HOME: Medicare Risk at Home Any stairs in or around the home?: (Patient-Rptd) No If so, are there any without handrails?: (Patient-Rptd) No Home free of loose throw rugs in walkways, pet beds, electrical cords, etc?:  (Patient-Rptd) Yes Adequate lighting in your home to reduce risk of falls?: (Patient-Rptd) Yes Life alert?: (Patient-Rptd) No Use of a cane, walker or w/c?: (Patient-Rptd) No Grab bars in the bathroom?: (Patient-Rptd) No Shower chair or bench in shower?: (Patient-Rptd) Yes Elevated toilet seat or a handicapped toilet?: (Patient-Rptd) Yes  TIMED UP AND GO:  Was the test performed?  No    Cognitive Function:        11/05/2023    2:54 PM 10/30/2022    2:45 PM  6CIT Screen  What Year? 0 points 0 points  What month? 0 points 0 points  What time? 0 points 0 points  Count back from 20 0 points 0 points  Months in reverse 0 points 0 points  Repeat phrase 0 points 0 points  Total Score 0 points 0 points    Immunizations Immunization History  Administered Date(s) Administered   Fluad Quad(high Dose 65+) 06/27/2021, 07/02/2022   Hepatitis B 07/01/1994   Influenza Split 07/10/2012, 07/10/2013, 08/18/2014, 07/16/2015   Influenza Whole 05/30/2011   Influenza, High Dose Seasonal PF 07/04/2023   Influenza,inj,Quad PF,6+ Mos 06/16/2018, 07/07/2019   Influenza,inj,quad, With Preservative 07/01/2017   Influenza-Unspecified 08/03/2016, 07/18/2020   Moderna Covid-19 Fall Seasonal Vaccine 7yrs & older 07/18/2023   PFIZER Comirnaty(Gray Top)Covid-19 Tri-Sucrose Vaccine 03/22/2020, 04/12/2020, 10/09/2020, 04/14/2021   Pneumococcal Conjugate-13 10/01/2013   Pneumococcal Polysaccharide-23 10/27/2021   Pneumococcal-Unspecified 10/01/2013   Respiratory Syncytial Virus Vaccine,Recomb Aduvanted(Arexvy) 08/07/2022   Tdap 10/01/2009, 05/22/2010, 10/19/2020   Unspecified SARS-COV-2 Vaccination 10/02/2021, 07/02/2022   Zoster Recombinant(Shingrix ) 05/20/2017, 11/13/2017   Zoster, Live 10/21/2015    TDAP status: Up to date  Flu Vaccine status: Up to date  Pneumococcal vaccine status: Up to date  Covid-19 vaccine status: Completed vaccines  Qualifies for Shingles Vaccine? Yes   Zostavax  completed Yes   Shingrix  Completed?: Yes  Screening Tests Health Maintenance  Topic Date Due   COVID-19 Vaccine (8 - 2024-25 season) 09/12/2023   Medicare Annual Wellness (AWV)  11/04/2024   MAMMOGRAM  01/07/2025   DTaP/Tdap/Td (4 - Td or Tdap) 10/19/2030   Colonoscopy  05/24/2032   Pneumonia Vaccine 44+ Years old  Completed   INFLUENZA VACCINE  Completed   DEXA SCAN  Completed   Hepatitis C Screening  Completed   Zoster Vaccines- Shingrix   Completed   HPV VACCINES  Aged Out    Health Maintenance  Health Maintenance Due  Topic Date Due   COVID-19 Vaccine (8 - 2024-25 season) 09/12/2023    Colorectal cancer screening: Type of screening: Colonoscopy. Completed 05/24/2022. Repeat every 10 years  Mammogram status: Completed 01/08/2023. Repeat every year  Bone Density status: Completed 06/23/2020.   Lung Cancer Screening: (Low Dose CT Chest recommended if Age 55-80 years, 20 pack-year currently smoking OR have quit w/in 15years.) does not qualify.   Lung Cancer Screening Referral: no  Additional Screening:  Hepatitis C Screening: does qualify; Completed 10/18/2015  Vision Screening: Recommended annual ophthalmology exams for early detection of glaucoma and other disorders of the eye. Is the patient up to date with their annual eye exam?  Yes  Who is the provider or what is the name of the office in which  the patient attends annual eye exams? Dr. Milissa If pt is not established with a provider, would they like to be referred to a provider to establish care? No .   Dental Screening: Recommended annual dental exams for proper oral hygiene  Diabetic Foot Exam: n/a  Community Resource Referral / Chronic Care Management: CRR required this visit?  No   CCM required this visit?  No     Plan:     I have personally reviewed and noted the following in the patient's chart:   Medical and social history Use of alcohol , tobacco or illicit drugs  Current medications and  supplements including opioid prescriptions. Patient is not currently taking opioid prescriptions. Functional ability and status Nutritional status Physical activity Advanced directives List of other physicians Hospitalizations, surgeries, and ER visits in previous 12 months Vitals Screenings to include cognitive, depression, and falls Referrals and appointments  In addition, I have reviewed and discussed with patient certain preventive protocols, quality metrics, and best practice recommendations. A written personalized care plan for preventive services as well as general preventive health recommendations were provided to patient.     Ardella FORBES Dawn, LPN   04/04/7973   After Visit Summary: (MyChart) Due to this being a telephonic visit, the after visit summary with patients personalized plan was offered to patient via MyChart   Nurse Notes: none

## 2023-11-05 NOTE — Patient Instructions (Signed)
 Annette Murray , Thank you for taking time to come for your Medicare Wellness Visit. I appreciate your ongoing commitment to your health goals. Please review the following plan we discussed and let me know if I can assist you in the future.   Referrals/Orders/Follow-Ups/Clinician Recommendations: none  This is a list of the screening recommended for you and due dates:  Health Maintenance  Topic Date Due   COVID-19 Vaccine (8 - 2024-25 season) 09/12/2023   Medicare Annual Wellness Visit  11/04/2024   Mammogram  01/07/2025   DTaP/Tdap/Td vaccine (4 - Td or Tdap) 10/19/2030   Colon Cancer Screening  05/24/2032   Pneumonia Vaccine  Completed   Flu Shot  Completed   DEXA scan (bone density measurement)  Completed   Hepatitis C Screening  Completed   Zoster (Shingles) Vaccine  Completed   HPV Vaccine  Aged Out    Advanced directives: (In Chart) A copy of your advanced directives are scanned into your chart should your provider ever need it.  Next Medicare Annual Wellness Visit scheduled for next year: Yes  insert Preventive Care attachment Insert FALL PREVENTION attachment if needed

## 2023-11-12 ENCOUNTER — Other Ambulatory Visit: Payer: Self-pay | Admitting: Family Medicine

## 2023-11-12 ENCOUNTER — Ambulatory Visit: Payer: PPO | Admitting: Family Medicine

## 2023-11-12 VITALS — BP 128/78 | HR 76 | Ht 62.0 in | Wt 131.0 lb

## 2023-11-12 DIAGNOSIS — I341 Nonrheumatic mitral (valve) prolapse: Secondary | ICD-10-CM | POA: Diagnosis not present

## 2023-11-12 DIAGNOSIS — G479 Sleep disorder, unspecified: Secondary | ICD-10-CM

## 2023-11-12 DIAGNOSIS — Z800 Family history of malignant neoplasm of digestive organs: Secondary | ICD-10-CM | POA: Diagnosis not present

## 2023-11-12 DIAGNOSIS — D126 Benign neoplasm of colon, unspecified: Secondary | ICD-10-CM | POA: Diagnosis not present

## 2023-11-12 DIAGNOSIS — E782 Mixed hyperlipidemia: Secondary | ICD-10-CM | POA: Diagnosis not present

## 2023-11-12 DIAGNOSIS — E785 Hyperlipidemia, unspecified: Secondary | ICD-10-CM | POA: Diagnosis not present

## 2023-11-12 DIAGNOSIS — F901 Attention-deficit hyperactivity disorder, predominantly hyperactive type: Secondary | ICD-10-CM

## 2023-11-12 DIAGNOSIS — I7300 Raynaud's syndrome without gangrene: Secondary | ICD-10-CM

## 2023-11-12 DIAGNOSIS — Z0000 Encounter for general adult medical examination without abnormal findings: Secondary | ICD-10-CM

## 2023-11-12 DIAGNOSIS — I10 Essential (primary) hypertension: Secondary | ICD-10-CM

## 2023-11-12 DIAGNOSIS — Z96659 Presence of unspecified artificial knee joint: Secondary | ICD-10-CM | POA: Diagnosis not present

## 2023-11-12 LAB — LIPID PANEL
Chol/HDL Ratio: 2 {ratio} (ref 0.0–4.4)
Cholesterol, Total: 224 mg/dL — ABNORMAL HIGH (ref 100–199)
HDL: 112 mg/dL (ref >39)
LDL Chol Calc (NIH): 98 mg/dL (ref 0–99)
Triglycerides: 80 mg/dL (ref 0–149)
VLDL Cholesterol Cal: 14 mg/dL (ref 5–40)

## 2023-11-12 LAB — COMPREHENSIVE METABOLIC PANEL
ALT: 19 [IU]/L (ref 0–32)
AST: 24 [IU]/L (ref 0–40)
Albumin: 4.3 g/dL (ref 3.9–4.9)
Alkaline Phosphatase: 104 [IU]/L (ref 44–121)
BUN/Creatinine Ratio: 11 — ABNORMAL LOW (ref 12–28)
BUN: 9 mg/dL (ref 8–27)
Bilirubin Total: 0.6 mg/dL (ref 0.0–1.2)
CO2: 25 mmol/L (ref 20–29)
Calcium: 9.1 mg/dL (ref 8.7–10.3)
Chloride: 100 mmol/L (ref 96–106)
Creatinine, Ser: 0.85 mg/dL (ref 0.57–1.00)
Globulin, Total: 2.1 g/dL (ref 1.5–4.5)
Glucose: 90 mg/dL (ref 70–99)
Potassium: 4 mmol/L (ref 3.5–5.2)
Sodium: 140 mmol/L (ref 134–144)
Total Protein: 6.4 g/dL (ref 6.0–8.5)
eGFR: 75 mL/min/{1.73_m2} (ref >59)

## 2023-11-12 LAB — CBC WITH DIFFERENTIAL/PLATELET
Basophils Absolute: 0 10*3/uL (ref 0.0–0.2)
Basos: 0 %
EOS (ABSOLUTE): 0.1 10*3/uL (ref 0.0–0.4)
Eos: 2 %
Hematocrit: 40.8 % (ref 34.0–46.6)
Hemoglobin: 14.3 g/dL (ref 11.1–15.9)
Immature Grans (Abs): 0 10*3/uL (ref 0.0–0.1)
Immature Granulocytes: 0 %
Lymphocytes Absolute: 1.2 10*3/uL (ref 0.7–3.1)
Lymphs: 28 %
MCH: 34.9 pg — ABNORMAL HIGH (ref 26.6–33.0)
MCHC: 35 g/dL (ref 31.5–35.7)
MCV: 100 fL — ABNORMAL HIGH (ref 79–97)
Monocytes Absolute: 0.4 10*3/uL (ref 0.1–0.9)
Monocytes: 9 %
Neutrophils Absolute: 2.5 10*3/uL (ref 1.4–7.0)
Neutrophils: 61 %
Platelets: 190 10*3/uL (ref 150–450)
RBC: 4.1 x10E6/uL (ref 3.77–5.28)
RDW: 11.8 % (ref 11.7–15.4)
WBC: 4.1 10*3/uL (ref 3.4–10.8)

## 2023-11-12 MED ORDER — ROSUVASTATIN CALCIUM 5 MG PO TABS: 5.0000 mg | ORAL_TABLET | Freq: Every day | ORAL | 3 refills | Status: DC

## 2023-11-12 MED ORDER — DILTIAZEM HCL ER COATED BEADS 180 MG PO CP24: 180.0000 mg | ORAL_CAPSULE | Freq: Every day | ORAL | 3 refills | Status: AC

## 2023-11-12 NOTE — Progress Notes (Signed)
Complete physical exam  Patient: Annette Murray   DOB: [birth year]   68 y.o. Female  MRN: [id number]  Subjective:    Chief Complaint  Patient presents with   Annual Exam    Fasting annual exam, had AWV this month w/[name]. Arthritis is worsening as well as sleep issues. Wants to increase diltiazem.     Annette Murray is a 68 y.o. female who presents today for a complete physical exam.  She reports consuming a general diet.  Not exercising  She generally feels well. She reports sleeping poorly.  She does have difficulty with her sleep pattern mainly waking up in the middle night and having difficulty falling asleep.  She has tried trazodone and does get some relief with no symptoms.  She continues on Adderall and gets roughly 8 hours of benefit out of it.  She continues have orthopedic issues with her wrist and is followed by Dr. Thad Ranger for this.  She does have evidence of Raynaud's and has had more difficulty this winter with this and would like increasing her Cardizem.  She had a colonoscopy in 2023 and is scheduled for follow-up since it did show evidence of adenomatous polyps.  She does have a previous history of MVP but has not had any chest pain, shortness of breath or palpitations.  She gets regular mammograms and has not had a Pap in the last couple years.  She has now semiretired but is taking care of her grandchildren on a regular basis.  Most recent fall risk assessment:    11/04/2023    7:40 PM  Fall Risk   Falls in the past year? 1  Comment stepped in a hole  Number falls in past yr: 0  Injury with Fall? 0  Risk for fall due to : Medication side effect  Follow up Falls prevention discussed;Falls evaluation completed     Most recent depression screenings:    11/05/2023    2:53 PM 10/30/2022    2:43 PM  PHQ 2/9 Scores  PHQ - 2 Score 0 0  PHQ- 9 Score 2 3    Vision:Within last year Dentist twice a year    Cardio: Dr. Desmond Dike: [facility] Dentist: Dr.  Leonie Man: [facility]: Dr. Rana Snare GI: Dr. Ewing Schlein  Immunization History  Administered Date(s) Administered   Fluad Quad(high Dose 65+) 06/27/2021, 07/02/2022   Hepatitis B 07/01/1994   Influenza Split 07/10/2012, 07/10/2013, 08/18/2014, 07/16/2015   Influenza Whole 05/30/2011   Influenza, High Dose Seasonal PF 07/04/2023   Influenza,inj,Quad PF,6+ Mos 06/16/2018, 07/07/2019   Influenza,inj,quad, With Preservative 07/01/2017   Influenza-Unspecified 08/03/2016, 07/18/2020   Moderna Covid-19 Fall Seasonal Vaccine 70yrs & older 07/18/2023   PFIZER Comirnaty(Gray Top)Covid-19 Tri-Sucrose Vaccine 03/22/2020, 04/12/2020, 10/09/2020, 04/14/2021   Pneumococcal Conjugate-13 10/01/2013   Pneumococcal Polysaccharide-23 10/27/2021   Pneumococcal-Unspecified 10/01/2013   Respiratory Syncytial Virus Vaccine,Recomb Aduvanted(Arexvy) 08/07/2022   Tdap 10/01/2009, 05/22/2010, 10/19/2020   Unspecified SARS-COV-2 Vaccination 10/02/2021, 07/02/2022   Zoster Recombinant(Shingrix) 05/20/2017, 11/13/2017   Zoster, Live 10/21/2015    Health Maintenance  Topic Date Due   COVID-19 Vaccine (8 - 2024-25 season) 09/12/2023   Medicare Annual Wellness (AWV)  11/04/2024   MAMMOGRAM  01/07/2025   DTaP/Tdap/Td (4 - Td or Tdap) 10/19/2030   Colonoscopy  05/24/2032   Pneumonia Vaccine 46+ Years old  Completed   INFLUENZA VACCINE  Completed   DEXA SCAN  Completed   Hepatitis C Screening  Completed   Zoster Vaccines- Shingrix  Completed  HPV VACCINES  Aged Out    Outpatient Medications Prior to Visit  Medication Sig Note   amphetamine-dextroamphetamine (ADDERALL XR) 20 MG 24 hr capsule Take 1 capsule (20 mg total) by mouth daily.    diclofenac Sodium (VOLTAREN) 1 % GEL Apply topically.    hydrochlorothiazide (MICROZIDE) 12.5 MG capsule TAKE 1 CAPSULE BY MOUTH EVERY DAY    hydroquinone 4 % cream APPLY TO AFFECTED AREA TWICE A DAY    Multiple Vitamin (MULTIVITAMIN WITH MINERALS) TABS tablet Take 1 tablet by  mouth daily.    Multiple Vitamins-Minerals (ZINC PO)     Nutritional Supplements (VITAMIN D BOOSTER PO) Vitamin D    traZODone (DESYREL) 50 MG tablet Take 1 tablet (50 mg total) by mouth at bedtime.    valACYclovir (VALTREX) 500 MG tablet TAKE 1 TABLET BY MOUTH EVERY DAY IN THE MORNING    [DISCONTINUED] diltiazem (CARDIZEM CD) 120 MG 24 hr capsule Take 1 capsule (120 mg total) by mouth daily.    fluticasone (FLONASE) 50 MCG/ACT nasal spray Place 1 spray into both nostrils at bedtime as needed for allergies or rhinitis. (Patient not taking: Reported on 11/12/2023) 11/12/2023: As needed   [DISCONTINUED] amphetamine-dextroamphetamine (ADDERALL XR) 20 MG 24 hr capsule Take 1 capsule (20 mg total) by mouth every morning.    [DISCONTINUED] amphetamine-dextroamphetamine (ADDERALL XR) 20 MG 24 hr capsule Take 1 capsule (20 mg total) by mouth daily.    [DISCONTINUED] aspirin EC 81 MG tablet Take 1 tablet (81 mg total) by mouth daily. Swallow whole.    [DISCONTINUED] PREMARIN vaginal cream  (Patient not taking: Reported on 11/05/2023)    [DISCONTINUED] rosuvastatin (CRESTOR) 5 MG tablet Take 1 tablet (5 mg total) by mouth daily.    [DISCONTINUED] vitamin k 100 MCG tablet     No facility-administered medications prior to visit.    Review of Systems  All other systems reviewed and are negative.   Family and social history as well as health maintenance and immunizations was reviewed.     Objective:    BP 128/78   Pulse 76   Ht 5\' 2"  (1.575 m)   Wt 131 lb (59.4 kg)   SpO2 98%   BMI 23.96 kg/m    Physical Exam  Alert and in no distress. Tympanic membranes and canals are normal. Pharyngeal area is normal. Neck is supple without adenopathy or thyromegaly. Cardiac exam shows a regular sinus rhythm without murmurs or gallops. Lungs are clear to auscultation.      Assessment & Plan:    Routine general medical examination at a health care facility  ADHD (attention deficit hyperactivity disorder),  predominantly hyperactive impulsive type  Essential hypertension - Plan: CBC with Differential/Platelet, Comprehensive metabolic panel  Family history of colon cancer  Hyperlipidemia LDL goal <100 - Plan: Lipid panel, rosuvastatin (CRESTOR) 5 MG tablet  MVP (mitral valve prolapse)  Raynaud's disease without gangrene - Plan: diltiazem (CARDIZEM CD) 180 MG 24 hr capsule  Tubular adenoma of colon  Sleep disturbance  Status post total knee replacement, unspecified laterality  Elevated triglycerides with high cholesterol - Plan: rosuvastatin (CRESTOR) 5 MG tablet  Information concerning sleep hygiene given.  Discussed the benefits of CBT for long-term sleep issues.  I will increase her Cardizem.  Continue on Crestor.  Continue on Adderall on an as-needed basis.      Sharlot Gowda, MD

## 2023-11-12 NOTE — Patient Instructions (Signed)
Insomnia Insomnia is a sleep disorder that makes it difficult to fall asleep or stay asleep. Insomnia can cause fatigue, low energy, difficulty concentrating, mood swings, and poor performance at work or school. There are three different ways to classify insomnia: Difficulty falling asleep. Difficulty staying asleep. Waking up too early in the morning. Any type of insomnia can be long-term (chronic) or short-term (acute). Both are common. Short-term insomnia usually lasts for 3 months or less. Chronic insomnia occurs at least three times a week for longer than 3 months. What are the causes? Insomnia may be caused by another condition, situation, or substance, such as: Having certain mental health conditions, such as anxiety and depression. Using caffeine, alcohol, tobacco, or drugs. Having gastrointestinal conditions, such as gastroesophageal reflux disease (GERD). Having certain medical conditions. These include: Asthma. Alzheimer's disease. Stroke. Chronic pain. An overactive thyroid gland (hyperthyroidism). Other sleep disorders, such as restless legs syndrome and sleep apnea. Menopause. Sometimes, the cause of insomnia may not be known. What increases the risk? Risk factors for insomnia include: Gender. Females are affected more often than males. Age. Insomnia is more common as people get older. Stress and certain medical and mental health conditions. Lack of exercise. Having an irregular work schedule. This may include working night shifts and traveling between different time zones. What are the signs or symptoms? If you have insomnia, the main symptom is having trouble falling asleep or having trouble staying asleep. This may lead to other symptoms, such as: Feeling tired or having low energy. Feeling nervous about going to sleep. Not feeling rested in the morning. Having trouble concentrating. Feeling irritable, anxious, or depressed. How is this diagnosed? This condition  may be diagnosed based on: Your symptoms and medical history. Your health care provider may ask about: Your sleep habits. Any medical conditions you have. Your mental health. A physical exam. How is this treated? Treatment for insomnia depends on the cause. Treatment may focus on treating an underlying condition that is causing the insomnia. Treatment may also include: Medicines to help you sleep. Counseling or therapy. Lifestyle adjustments to help you sleep better. Follow these instructions at home: Eating and drinking  Limit or avoid alcohol, caffeinated beverages, and products that contain nicotine and tobacco, especially close to bedtime. These can disrupt your sleep. Do not eat a large meal or eat spicy foods right before bedtime. This can lead to digestive discomfort that can make it hard for you to sleep. Sleep habits  Keep a sleep diary to help you and your health care provider figure out what could be causing your insomnia. Write down: When you sleep. When you wake up during the night. How well you sleep and how rested you feel the next day. Any side effects of medicines you are taking. What you eat and drink. Make your bedroom a dark, comfortable place where it is easy to fall asleep. Put up shades or blackout curtains to block light from outside. Use a white noise machine to block noise. Keep the temperature cool. Limit screen use before bedtime. This includes: Not watching TV. Not using your smartphone, tablet, or computer. Stick to a routine that includes going to bed and waking up at the same times every day and night. This can help you fall asleep faster. Consider making a quiet activity, such as reading, part of your nighttime routine. Try to avoid taking naps during the day so that you sleep better at night. Get out of bed if you are still awake after  15 minutes of trying to sleep. Keep the lights down, but try reading or doing a quiet activity. When you feel  sleepy, go back to bed. General instructions Take over-the-counter and prescription medicines only as told by your health care provider. Exercise regularly as told by your health care provider. However, avoid exercising in the hours right before bedtime. Use relaxation techniques to manage stress. Ask your health care provider to suggest some techniques that may work well for you. These may include: Breathing exercises. Routines to release muscle tension. Visualizing peaceful scenes. Make sure that you drive carefully. Do not drive if you feel very sleepy. Keep all follow-up visits. This is important. Contact a health care provider if: You are tired throughout the day. You have trouble in your daily routine due to sleepiness. You continue to have sleep problems, or your sleep problems get worse. Get help right away if: You have thoughts about hurting yourself or someone else. Get help right away if you feel like you may hurt yourself or others, or have thoughts about taking your own life. Go to your nearest emergency room or: Call 911. Call the National Suicide Prevention Lifeline at (906)021-1611 or 988. This is open 24 hours a day. Text the Crisis Text Line at 325-069-2793. Summary Insomnia is a sleep disorder that makes it difficult to fall asleep or stay asleep. Insomnia can be long-term (chronic) or short-term (acute). Treatment for insomnia depends on the cause. Treatment may focus on treating an underlying condition that is causing the insomnia. Keep a sleep diary to help you and your health care provider figure out what could be causing your insomnia. This information is not intended to replace advice given to you by your health care provider. Make sure you discuss any questions you have with your health care provider. Document Revised: 08/28/2021 Document Reviewed: 08/28/2021 Elsevier Patient Education  2024 [employer].

## 2023-11-13 ENCOUNTER — Encounter: Payer: Self-pay | Admitting: Family Medicine

## 2023-11-13 MED ORDER — ROSUVASTATIN CALCIUM 10 MG PO TABS
10.0000 mg | ORAL_TABLET | Freq: Every day | ORAL | 3 refills | Status: AC
Start: 2023-11-13 — End: ?

## 2023-11-13 NOTE — Addendum Note (Signed)
Addended by: Ronnald Nian on: 11/13/2023 08:15 AM   Modules accepted: Orders

## 2023-11-14 ENCOUNTER — Other Ambulatory Visit: Payer: Self-pay | Admitting: Family Medicine

## 2023-11-14 DIAGNOSIS — B001 Herpesviral vesicular dermatitis: Secondary | ICD-10-CM

## 2023-11-14 NOTE — Telephone Encounter (Signed)
Is this okay to refill?

## 2023-11-26 ENCOUNTER — Encounter: Payer: Self-pay | Admitting: Internal Medicine

## 2023-12-03 ENCOUNTER — Other Ambulatory Visit: Payer: Self-pay | Admitting: Family Medicine

## 2023-12-03 DIAGNOSIS — F901 Attention-deficit hyperactivity disorder, predominantly hyperactive type: Secondary | ICD-10-CM

## 2023-12-03 MED ORDER — AMPHETAMINE-DEXTROAMPHET ER 20 MG PO CP24: 20.0000 mg | ORAL_CAPSULE | Freq: Every day | ORAL | 0 refills | Status: DC

## 2023-12-03 NOTE — Telephone Encounter (Signed)
 Copied from CRM [id number]. Topic: Clinical - Medication Refill >> Dec 03, 2023  2:01 PM Ja-Kwan M wrote: Most Recent Primary Care Visit:  Provider: Ronnald Nian  Department: [facility]  Visit Type: MEDICARE WELL VISIT 45  Date: 11/12/2023  Medication: amphetamine-dextroamphetamine (ADDERALL XR) 20 MG 24 hr capsule  Has the patient contacted their pharmacy? No  Is this the correct pharmacy for this prescription? Yes If no, delete pharmacy and type the correct one.  This is the patient's preferred pharmacy:  [id number] - [place], [place] - [address] AT CORNER OF [facility] [address] [place] [address] Phone: [phone] Fax: [phone]  Has the prescription been filled recently? No  Is the patient out of the medication? No  Has the patient been seen for an appointment in the last year OR does the patient have an upcoming appointment? Yes  Can we respond through MyChart? Yes  Agent: Please be advised that Rx refills may take up to 3 business days. We ask that you follow-up with your pharmacy.

## 2023-12-03 NOTE — Telephone Encounter (Signed)
 Last apt 11/12/23

## 2023-12-27 DIAGNOSIS — M76822 Posterior tibial tendinitis, left leg: Secondary | ICD-10-CM | POA: Diagnosis not present

## 2023-12-27 DIAGNOSIS — M2142 Flat foot [pes planus] (acquired), left foot: Secondary | ICD-10-CM | POA: Diagnosis not present

## 2024-01-06 ENCOUNTER — Other Ambulatory Visit: Payer: Self-pay | Admitting: Family Medicine

## 2024-01-06 DIAGNOSIS — F901 Attention-deficit hyperactivity disorder, predominantly hyperactive type: Secondary | ICD-10-CM

## 2024-01-06 NOTE — Telephone Encounter (Unsigned)
 Copied from CRM [id number]. Topic: Clinical - Medication Refill >> Jan 06, 2024 12:30 PM [name] wrote: Most Recent Primary Care Visit:  Provider: Ronnald Nian  Department: [facility]  Visit Type: MEDICARE WELL VISIT 45  Date: 11/12/2023  Medication: amphetamine-dextroamphetamine (ADDERALL XR) 20 MG 24 hr capsule  Has the patient contacted their pharmacy? No   Is this the correct pharmacy for this prescription? Yes If no, delete pharmacy and type the correct one.  This is the patient's preferred pharmacy:  [id number] - [place], [place] - [address] AT CORNER OF [facility] [address] [place] [address] Phone: [phone] Fax: [phone]    Has the prescription been filled recently? No  Is the patient out of the medication? No but she is running low  Has the patient been seen for an appointment in the last year OR does the patient have an upcoming appointment? Yes  Can we respond through MyChart? Yes   Please assist patient further

## 2024-01-07 MED ORDER — AMPHETAMINE-DEXTROAMPHET ER 20 MG PO CP24: 20.0000 mg | ORAL_CAPSULE | Freq: Every day | ORAL | 0 refills | Status: DC

## 2024-02-06 ENCOUNTER — Other Ambulatory Visit: Payer: Self-pay | Admitting: Family Medicine

## 2024-02-06 DIAGNOSIS — F901 Attention-deficit hyperactivity disorder, predominantly hyperactive type: Secondary | ICD-10-CM

## 2024-02-06 MED ORDER — AMPHETAMINE-DEXTROAMPHET ER 20 MG PO CP24
20.0000 mg | ORAL_CAPSULE | Freq: Every day | ORAL | 0 refills | Status: DC
Start: 2024-02-06 — End: 2024-03-11

## 2024-02-06 NOTE — Telephone Encounter (Signed)
 Copied from CRM [id number]. Topic: Clinical - Medication Refill >> Feb 06, 2024 11:13 AM Zipporah Him wrote: Medication: amphetamine -dextroamphetamine  (ADDERALL XR) 20 MG 24 hr capsule  Has the patient contacted their pharmacy? No No refills   This is the patient's preferred pharmacy:  [id number] - [place], [place] - [address] AT CORNER OF [facility] [address] [place] [address] Phone: [phone] Fax: [phone]  Is this the correct pharmacy for this prescription? Yes If no, delete pharmacy and type the correct one.   Has the prescription been filled recently? No  Is the patient out of the medication? No, couple days left  Has the patient been seen for an appointment in the last year OR does the patient have an upcoming appointment? Yes  Can we respond through MyChart? No  Agent: Please be advised that Rx refills may take up to 3 business days. We ask that you follow-up with your pharmacy.

## 2024-02-07 ENCOUNTER — Other Ambulatory Visit: Payer: Self-pay | Admitting: Family Medicine

## 2024-02-07 DIAGNOSIS — Z1231 Encounter for screening mammogram for malignant neoplasm of breast: Secondary | ICD-10-CM

## 2024-02-21 ENCOUNTER — Ambulatory Visit
Admission: RE | Admit: 2024-02-21 | Discharge: 2024-02-21 | Disposition: A | Discharge status: Home / Self Care | Source: Ambulatory Visit | Attending: Family Medicine | Admitting: Family Medicine

## 2024-02-21 DIAGNOSIS — Z1231 Encounter for screening mammogram for malignant neoplasm of breast: Secondary | ICD-10-CM

## 2024-03-11 ENCOUNTER — Other Ambulatory Visit: Payer: Self-pay | Admitting: Family Medicine

## 2024-03-11 DIAGNOSIS — F901 Attention-deficit hyperactivity disorder, predominantly hyperactive type: Secondary | ICD-10-CM

## 2024-03-11 MED ORDER — AMPHETAMINE-DEXTROAMPHET ER 20 MG PO CP24: 20.0000 mg | ORAL_CAPSULE | Freq: Every day | ORAL | 0 refills | Status: DC

## 2024-03-11 NOTE — Telephone Encounter (Signed)
 Copied from CRM [id number]. Topic: Clinical - Medication Refill >> Mar 11, 2024  1:28 PM [place] S wrote: Medication: amphetamine -dextroamphetamine  (ADDERALL XR) 20 MG 24 hr capsule  Has the patient contacted their pharmacy? No (Agent: If no, request that the patient contact the pharmacy for the refill. If patient does not wish to contact the pharmacy document the reason why and proceed with request.) (Agent: If yes, when and what did the pharmacy advise?)  This is the patient's preferred pharmacy:  [id number] - [place], [place] - [address] AT CORNER OF [facility] [address] [place] [address] Phone: [phone] Fax: [phone]  Is this the correct pharmacy for this prescription? Yes If no, delete pharmacy and type the correct one.   Has the prescription been filled recently? No  Is the patient out of the medication? No  Has the patient been seen for an appointment in the last year OR does the patient have an upcoming appointment? Yes  Can we respond through MyChart? Yes  Agent: Please be advised that Rx refills may take up to 3 business days. We ask that you follow-up with your pharmacy.

## 2024-03-12 DIAGNOSIS — D485 Neoplasm of uncertain behavior of skin: Secondary | ICD-10-CM | POA: Diagnosis not present

## 2024-03-12 DIAGNOSIS — D1801 Hemangioma of skin and subcutaneous tissue: Secondary | ICD-10-CM | POA: Diagnosis not present

## 2024-03-12 DIAGNOSIS — L814 Other melanin hyperpigmentation: Secondary | ICD-10-CM | POA: Diagnosis not present

## 2024-03-12 DIAGNOSIS — L570 Actinic keratosis: Secondary | ICD-10-CM | POA: Diagnosis not present

## 2024-03-12 DIAGNOSIS — D225 Melanocytic nevi of trunk: Secondary | ICD-10-CM | POA: Diagnosis not present

## 2024-03-12 DIAGNOSIS — D229 Melanocytic nevi, unspecified: Secondary | ICD-10-CM | POA: Diagnosis not present

## 2024-03-12 DIAGNOSIS — L578 Other skin changes due to chronic exposure to nonionizing radiation: Secondary | ICD-10-CM | POA: Diagnosis not present

## 2024-03-12 DIAGNOSIS — L821 Other seborrheic keratosis: Secondary | ICD-10-CM | POA: Diagnosis not present

## 2024-04-21 ENCOUNTER — Other Ambulatory Visit: Payer: Self-pay | Admitting: Family Medicine

## 2024-04-21 DIAGNOSIS — F901 Attention-deficit hyperactivity disorder, predominantly hyperactive type: Secondary | ICD-10-CM

## 2024-04-21 MED ORDER — AMPHETAMINE-DEXTROAMPHET ER 20 MG PO CP24: 20.0000 mg | ORAL_CAPSULE | Freq: Every day | ORAL | 0 refills | Status: DC

## 2024-04-21 NOTE — Telephone Encounter (Signed)
 Copied from CRM #[id number]. Topic: Clinical - Medication Refill >> Apr 21, 2024  1:46 PM Marissa P wrote: Medication: amphetamine -dextroamphetamine  (ADDERALL XR) 20 MG 24 hr capsule  Has the patient contacted their pharmacy? No (Agent: If no, request that the patient contact the pharmacy for the refill. If patient does not wish to contact the pharmacy document the reason why and proceed with request.) (Agent: If yes, when and what did the pharmacy advise?)  This is the patient's preferred pharmacy:  [id number] - [place], [place] - [address] AT CORNER OF [facility] [address] [place] [address] Phone: [phone] Fax: [phone]  Is this the correct pharmacy for this prescription? Yes If no, delete pharmacy and type the correct one.   Has the prescription been filled recently? Yes  Is the patient out of the medication? No  Has the patient been seen for an appointment in the last year OR does the patient have an upcoming appointment? Yes  Can we respond through MyChart? Yes  Agent: Please be advised that Rx refills may take up to 3 business days. We ask that you follow-up with your pharmacy.

## 2024-04-21 NOTE — Telephone Encounter (Signed)
 Last OV: 11/12/2023

## 2024-04-28 DIAGNOSIS — D235 Other benign neoplasm of skin of trunk: Secondary | ICD-10-CM | POA: Diagnosis not present

## 2024-04-28 DIAGNOSIS — L905 Scar conditions and fibrosis of skin: Secondary | ICD-10-CM | POA: Diagnosis not present

## 2024-04-28 DIAGNOSIS — D239 Other benign neoplasm of skin, unspecified: Secondary | ICD-10-CM | POA: Diagnosis not present

## 2024-05-26 ENCOUNTER — Other Ambulatory Visit: Payer: Self-pay | Admitting: Family Medicine

## 2024-05-26 DIAGNOSIS — F901 Attention-deficit hyperactivity disorder, predominantly hyperactive type: Secondary | ICD-10-CM

## 2024-05-26 NOTE — Telephone Encounter (Unsigned)
 Copied from CRM [id number]. Topic: Clinical - Medication Refill >> May 26, 2024  3:34 PM Ber[place] MATSU wrote: Medication: amphetamine -dextroamphetamine  (ADDERALL XR) 20 MG 24 hr capsule  Has the patient contacted their pharmacy? Yes (Agent: If no, request that the patient contact the pharmacy for the refill. If patient does not wish to contact the pharmacy document the reason why and proceed with request.) (Agent: If yes, when and what did the pharmacy advise?)  This is the patient's preferred pharmacy:  [id number] - [place], [place] - [address] AT CORNER OF [facility] [address] [place] [address] Phone: [phone] Fax: [phone]  Is this the correct pharmacy for this prescription? Yes If no, delete pharmacy and type the correct one.   Has the prescription been filled recently? Yes  Is the patient out of the medication? No  Has the patient been seen for an appointment in the last year OR does the patient have an upcoming appointment? Yes  Can we respond through MyChart? Yes  Agent: Please be advised that Rx refills may take up to 3 business days. We ask that you follow-up with your pharmacy.

## 2024-05-27 MED ORDER — AMPHETAMINE-DEXTROAMPHET ER 20 MG PO CP24: 20.0000 mg | ORAL_CAPSULE | Freq: Every day | ORAL | 0 refills | Status: DC

## 2024-05-27 NOTE — Telephone Encounter (Signed)
 Is this okay to refill?

## 2024-07-06 ENCOUNTER — Other Ambulatory Visit: Payer: Self-pay | Admitting: Family Medicine

## 2024-07-06 DIAGNOSIS — F901 Attention-deficit hyperactivity disorder, predominantly hyperactive type: Secondary | ICD-10-CM

## 2024-07-06 MED ORDER — AMPHETAMINE-DEXTROAMPHET ER 20 MG PO CP24: 20.0000 mg | ORAL_CAPSULE | Freq: Every day | ORAL | 0 refills | Status: DC

## 2024-07-06 NOTE — Telephone Encounter (Signed)
 Copied from CRM [id number]. Topic: Clinical - Medication Refill >> Jul 06, 2024 11:46 AM Wess RAMAN wrote: Medication: amphetamine -dextroamphetamine  (ADDERALL XR) 20 MG 24 hr capsule   Has the patient contacted their pharmacy? Yes (Agent: If no, request that the patient contact the pharmacy for the refill. If patient does not wish to contact the pharmacy document the reason why and proceed with request.) (Agent: If yes, when and what did the pharmacy advise?) Contact doctor  This is the patient's preferred pharmacy:  [id number] - [place], [place] - [address] AT CORNER OF [facility] [address] [place] [address] Phone: [phone] Fax: [phone]  Is this the correct pharmacy for this prescription? Yes If no, delete pharmacy and type the correct one.   Has the prescription been filled recently? Yes  Is the patient out of the medication? No  Has the patient been seen for an appointment in the last year OR does the patient have an upcoming appointment? Yes  Can we respond through MyChart? Yes  Agent: Please be advised that Rx refills may take up to 3 business days. We ask that you follow-up with your pharmacy.

## 2024-08-12 ENCOUNTER — Other Ambulatory Visit: Payer: Self-pay | Admitting: Family Medicine

## 2024-08-12 DIAGNOSIS — F901 Attention-deficit hyperactivity disorder, predominantly hyperactive type: Secondary | ICD-10-CM

## 2024-08-12 MED ORDER — AMPHETAMINE-DEXTROAMPHET ER 20 MG PO CP24: 20.0000 mg | ORAL_CAPSULE | Freq: Every day | ORAL | 0 refills | Status: DC

## 2024-08-12 NOTE — Telephone Encounter (Signed)
 Copied from CRM #[id number]. Topic: Clinical - Medication Refill >> Aug 12, 2024 11:00 AM Charlet HERO wrote: Medication: amphetamine -dextroamphetamine  (ADDERALL XR) 20 MG 24 hr capsule  Has the patient contacted their pharmacy? Yes 0 refils  This is the patient's preferred pharmacy:  [id number] - [place], [place] - [address] AT CORNER OF [facility] [address] [place] [address] Phone: [phone] Fax: [phone]  Is this the correct pharmacy for this prescription? Yes If no, delete pharmacy and type the correct one.   Has the prescription been filled recently? Yes  Is the patient out of the medication? No  Has the patient been seen for an appointment in the last year OR does the patient have an upcoming appointment? Yes  Can we respond through MyChart? Yes  Agent: Please be advised that Rx refills may take up to 3 business days. We ask that you follow-up with your pharmacy.

## 2024-08-28 ENCOUNTER — Other Ambulatory Visit: Payer: Self-pay | Admitting: Family Medicine

## 2024-08-28 DIAGNOSIS — B001 Herpesviral vesicular dermatitis: Secondary | ICD-10-CM

## 2024-09-17 ENCOUNTER — Other Ambulatory Visit: Payer: Self-pay | Admitting: Family Medicine

## 2024-09-17 DIAGNOSIS — F901 Attention-deficit hyperactivity disorder, predominantly hyperactive type: Secondary | ICD-10-CM

## 2024-09-17 MED ORDER — AMPHETAMINE-DEXTROAMPHET ER 20 MG PO CP24: 20.0000 mg | ORAL_CAPSULE | Freq: Every day | ORAL | 0 refills | Status: DC

## 2024-09-17 NOTE — Telephone Encounter (Signed)
 Copied from CRM #[id number]. Topic: Clinical - Medication Refill >> Sep 17, 2024 11:25 AM Darshell M wrote: Medication: amphetamine -dextroamphetamine  (ADDERALL XR) 20 MG 24 hr capsule  Has the patient contacted their pharmacy? Yes (Agent: If no, request that the patient contact the pharmacy for the refill. If patient does not wish to contact the pharmacy document the reason why and proceed with request.) (Agent: If yes, when and what did the pharmacy advise?)  This is the patient's preferred pharmacy:  [id number] - [place], [place] - [address] AT CORNER OF [facility] [address] [place] [address] Phone: [phone] Fax: [phone]  Is this the correct pharmacy for this prescription? Yes If no, delete pharmacy and type the correct one.   Has the prescription been filled recently? No  Is the patient out of the medication? No  Has the patient been seen for an appointment in the last year OR does the patient have an upcoming appointment? Yes  Can we respond through MyChart? Yes  Agent: Please be advised that Rx refills may take up to 3 business days. We ask that you follow-up with your pharmacy.

## 2024-10-18 ENCOUNTER — Other Ambulatory Visit: Payer: Self-pay | Admitting: Family Medicine

## 2024-10-18 DIAGNOSIS — G479 Sleep disorder, unspecified: Secondary | ICD-10-CM

## 2024-10-21 ENCOUNTER — Telehealth: Payer: Self-pay | Admitting: Internal Medicine

## 2024-10-21 DIAGNOSIS — F901 Attention-deficit hyperactivity disorder, predominantly hyperactive type: Secondary | ICD-10-CM

## 2024-10-21 MED ORDER — AMPHETAMINE-DEXTROAMPHET ER 20 MG PO CP24: 20.0000 mg | ORAL_CAPSULE | Freq: Every day | ORAL | 0 refills | Status: AC

## 2024-10-21 NOTE — Telephone Encounter (Signed)
 Copied from CRM #[id number]. Topic: Clinical - Medication Refill >> Oct 21, 2024  2:38 PM Kevelyn M wrote: Medication: amphetamine -dextroamphetamine  (ADDERALL XR)  Has the patient contacted their pharmacy? No (Agent: If no, request that the patient contact the pharmacy for the refill. If patient does not wish to contact the pharmacy document the reason why and proceed with request.) (Agent: If yes, when and what did the pharmacy advise?)  This is the patient's preferred pharmacy:  [id number] - [place], [place] - [address] AT [facility] [address] [place] [address] Phone: [phone] Fax: [phone]  Is this the correct pharmacy for this prescription? Yes If no, delete pharmacy and type the correct one.   Has the prescription been filled recently? No  Is the patient out of the medication? no  Has the patient been seen for an appointment in the last year OR does the patient have an upcoming appointment? Yes  Can we respond through MyChart? Yes  Agent: Please be advised that Rx refills may take up to 3 business days. We ask that you follow-up with your pharmacy.

## 2024-11-10 ENCOUNTER — Ambulatory Visit: Payer: PPO

## 2024-11-12 ENCOUNTER — Ambulatory Visit: Payer: PPO | Admitting: Family Medicine
